# Patient Record
Sex: Male | Born: 1955 | Race: White | Hispanic: No | Marital: Married | Smoking: Former smoker
Health system: Southern US, Community
[De-identification: ages and names within clinical notes are randomized; demographics above are authoritative.]

## PROBLEM LIST (undated history)

## (undated) DIAGNOSIS — E119 Type 2 diabetes mellitus without complications: Secondary | ICD-10-CM

## (undated) DIAGNOSIS — K429 Umbilical hernia without obstruction or gangrene: Secondary | ICD-10-CM

## (undated) DIAGNOSIS — F419 Anxiety disorder, unspecified: Secondary | ICD-10-CM

## (undated) DIAGNOSIS — I10 Essential (primary) hypertension: Secondary | ICD-10-CM

## (undated) DIAGNOSIS — G4733 Obstructive sleep apnea (adult) (pediatric): Secondary | ICD-10-CM

## (undated) DIAGNOSIS — F32A Depression, unspecified: Secondary | ICD-10-CM

## (undated) DIAGNOSIS — F329 Major depressive disorder, single episode, unspecified: Secondary | ICD-10-CM

## (undated) DIAGNOSIS — I208 Other forms of angina pectoris: Secondary | ICD-10-CM

## (undated) DIAGNOSIS — I2089 Other forms of angina pectoris: Secondary | ICD-10-CM

## (undated) DIAGNOSIS — M1990 Unspecified osteoarthritis, unspecified site: Secondary | ICD-10-CM

## (undated) DIAGNOSIS — K219 Gastro-esophageal reflux disease without esophagitis: Secondary | ICD-10-CM

---
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
## 1974-07-25 HISTORY — PX: APPENDECTOMY: SHX54

## 1979-03-26 HISTORY — PX: INCISION AND DRAINAGE ABSCESS ANAL: SUR669

## 1989-03-25 HISTORY — PX: UVULECTOMY: SHX2631

## 1989-03-25 HISTORY — PX: SEPTOPLASTY: SUR1290

## 2004-07-25 HISTORY — PX: CARDIAC CATHETERIZATION: SHX172

## 2013-11-04 ENCOUNTER — Emergency Department (HOSPITAL_COMMUNITY): Payer: Self-pay

## 2013-11-04 ENCOUNTER — Observation Stay (HOSPITAL_COMMUNITY)
Admission: EM | Admit: 2013-11-04 | Discharge: 2013-11-06 | Disposition: A | Discharge status: Home / Self Care | Payer: Self-pay | Attending: Internal Medicine | Admitting: Internal Medicine

## 2013-11-04 ENCOUNTER — Encounter (HOSPITAL_COMMUNITY): Payer: Self-pay | Admitting: Emergency Medicine

## 2013-11-04 DIAGNOSIS — R079 Chest pain, unspecified: Secondary | ICD-10-CM

## 2013-11-04 DIAGNOSIS — R0602 Shortness of breath: Secondary | ICD-10-CM | POA: Insufficient documentation

## 2013-11-04 DIAGNOSIS — E1165 Type 2 diabetes mellitus with hyperglycemia: Secondary | ICD-10-CM

## 2013-11-04 DIAGNOSIS — Z23 Encounter for immunization: Secondary | ICD-10-CM | POA: Insufficient documentation

## 2013-11-04 DIAGNOSIS — Z8679 Personal history of other diseases of the circulatory system: Secondary | ICD-10-CM | POA: Insufficient documentation

## 2013-11-04 DIAGNOSIS — Z79899 Other long term (current) drug therapy: Secondary | ICD-10-CM | POA: Insufficient documentation

## 2013-11-04 DIAGNOSIS — R5381 Other malaise: Secondary | ICD-10-CM | POA: Insufficient documentation

## 2013-11-04 DIAGNOSIS — Z8659 Personal history of other mental and behavioral disorders: Secondary | ICD-10-CM | POA: Insufficient documentation

## 2013-11-04 DIAGNOSIS — E119 Type 2 diabetes mellitus without complications: Secondary | ICD-10-CM | POA: Insufficient documentation

## 2013-11-04 DIAGNOSIS — I200 Unstable angina: Secondary | ICD-10-CM | POA: Diagnosis present

## 2013-11-04 DIAGNOSIS — R0789 Other chest pain: Principal | ICD-10-CM | POA: Insufficient documentation

## 2013-11-04 DIAGNOSIS — R5383 Other fatigue: Secondary | ICD-10-CM

## 2013-11-04 DIAGNOSIS — M549 Dorsalgia, unspecified: Secondary | ICD-10-CM | POA: Insufficient documentation

## 2013-11-04 DIAGNOSIS — I10 Essential (primary) hypertension: Secondary | ICD-10-CM

## 2013-11-04 DIAGNOSIS — IMO0002 Reserved for concepts with insufficient information to code with codable children: Secondary | ICD-10-CM

## 2013-11-04 HISTORY — DX: Major depressive disorder, single episode, unspecified: F32.9

## 2013-11-04 HISTORY — DX: Depression, unspecified: F32.A

## 2013-11-04 HISTORY — DX: Obstructive sleep apnea (adult) (pediatric): G47.33

## 2013-11-04 HISTORY — DX: Unspecified osteoarthritis, unspecified site: M19.90

## 2013-11-04 HISTORY — DX: Umbilical hernia without obstruction or gangrene: K42.9

## 2013-11-04 HISTORY — DX: Essential (primary) hypertension: I10

## 2013-11-04 HISTORY — DX: Other forms of angina pectoris: I20.8

## 2013-11-04 HISTORY — DX: Other forms of angina pectoris: I20.89

## 2013-11-04 HISTORY — DX: Anxiety disorder, unspecified: F41.9

## 2013-11-04 HISTORY — DX: Type 2 diabetes mellitus without complications: E11.9

## 2013-11-04 HISTORY — DX: Gastro-esophageal reflux disease without esophagitis: K21.9

## 2013-11-04 LAB — LIPID PANEL
CHOL/HDL RATIO: 5.9 ratio
Cholesterol: 165 mg/dL (ref 0–200)
HDL: 28 mg/dL — ABNORMAL LOW (ref >39)
LDL CALC: 88 mg/dL (ref 0–99)
Triglycerides: 243 mg/dL — ABNORMAL HIGH (ref <150)
VLDL: 49 mg/dL — ABNORMAL HIGH (ref 0–40)

## 2013-11-04 LAB — CBC WITH DIFFERENTIAL/PLATELET
Basophils Absolute: 0 10*3/uL (ref 0.0–0.1)
Basophils Relative: 0 % (ref 0–1)
EOS PCT: 2 % (ref 0–5)
Eosinophils Absolute: 0.2 10*3/uL (ref 0.0–0.7)
HCT: 45.2 % (ref 39.0–52.0)
HEMOGLOBIN: 16.3 g/dL (ref 13.0–17.0)
LYMPHS ABS: 2.3 10*3/uL (ref 0.7–4.0)
Lymphocytes Relative: 29 % (ref 12–46)
MCH: 30.8 pg (ref 26.0–34.0)
MCHC: 36.1 g/dL — ABNORMAL HIGH (ref 30.0–36.0)
MCV: 85.3 fL (ref 78.0–100.0)
Monocytes Absolute: 0.6 10*3/uL (ref 0.1–1.0)
Monocytes Relative: 8 % (ref 3–12)
Neutro Abs: 5 10*3/uL (ref 1.7–7.7)
Neutrophils Relative %: 61 % (ref 43–77)
Platelets: 163 10*3/uL (ref 150–400)
RBC: 5.3 MIL/uL (ref 4.22–5.81)
RDW: 13.2 % (ref 11.5–15.5)
WBC: 8.1 10*3/uL (ref 4.0–10.5)

## 2013-11-04 LAB — TROPONIN I: Troponin I: <0.3 ng/mL (ref <0.30)

## 2013-11-04 LAB — HEMOGLOBIN A1C
Hgb A1c MFr Bld: 9.4 % — ABNORMAL HIGH (ref <5.7)
Mean Plasma Glucose: 223 mg/dL — ABNORMAL HIGH (ref <117)

## 2013-11-04 LAB — RAPID URINE DRUG SCREEN, HOSP PERFORMED
AMPHETAMINES: NOT DETECTED
BENZODIAZEPINES: NOT DETECTED
Barbiturates: NOT DETECTED
Cocaine: NOT DETECTED
Opiates: NOT DETECTED
Tetrahydrocannabinol: NOT DETECTED

## 2013-11-04 LAB — COMPREHENSIVE METABOLIC PANEL
ALT: 21 U/L (ref 0–53)
AST: 19 U/L (ref 0–37)
Albumin: 3.9 g/dL (ref 3.5–5.2)
Alkaline Phosphatase: 62 U/L (ref 39–117)
BUN: 12 mg/dL (ref 6–23)
CALCIUM: 9.5 mg/dL (ref 8.4–10.5)
CO2: 21 mEq/L (ref 19–32)
Chloride: 107 mEq/L (ref 96–112)
Creatinine, Ser: 0.75 mg/dL (ref 0.50–1.35)
GLUCOSE: 204 mg/dL — AB (ref 70–99)
Potassium: 4.3 mEq/L (ref 3.7–5.3)
Sodium: 144 mEq/L (ref 137–147)
Total Bilirubin: 1 mg/dL (ref 0.3–1.2)
Total Protein: 6.9 g/dL (ref 6.0–8.3)

## 2013-11-04 LAB — PRO B NATRIURETIC PEPTIDE: Pro B Natriuretic peptide (BNP): 25.3 pg/mL (ref 0–125)

## 2013-11-04 LAB — GLUCOSE, CAPILLARY
Glucose-Capillary: 95 mg/dL (ref 70–99)
Glucose-Capillary: 232 mg/dL — ABNORMAL HIGH (ref 70–99)

## 2013-11-04 LAB — TSH: TSH: 3.07 u[IU]/mL (ref 0.350–4.500)

## 2013-11-04 LAB — D-DIMER, QUANTITATIVE (NOT AT ARMC)

## 2013-11-04 MED ORDER — ATORVASTATIN CALCIUM 80 MG PO TABS
80.0000 mg | ORAL_TABLET | Freq: Every day | ORAL | Status: DC
  Filled 2013-11-04: qty 1

## 2013-11-04 MED ORDER — ASPIRIN EC 81 MG PO TBEC
81.0000 mg | DELAYED_RELEASE_TABLET | Freq: Every day | ORAL | Status: DC
  Administered 2013-11-05 – 2013-11-06 (×2): 81 mg via ORAL
  Filled 2013-11-04 (×2): qty 1

## 2013-11-04 MED ORDER — HEPARIN (PORCINE) IN NACL 100-0.45 UNIT/ML-% IJ SOLN
1600.0000 [IU]/h | INTRAMUSCULAR | Status: DC
  Administered 2013-11-04: 1300 [IU]/h via INTRAVENOUS
  Administered 2013-11-05: 1600 [IU]/h via INTRAVENOUS
  Filled 2013-11-04 (×3): qty 250

## 2013-11-04 MED ORDER — INSULIN ASPART 100 UNIT/ML ~~LOC~~ SOLN
0.0000 [IU] | Freq: Three times a day (TID) | SUBCUTANEOUS | Status: DC
  Administered 2013-11-05: 3 [IU] via SUBCUTANEOUS

## 2013-11-04 MED ORDER — ENOXAPARIN SODIUM 40 MG/0.4ML ~~LOC~~ SOLN
40.0000 mg | SUBCUTANEOUS | Status: DC
  Filled 2013-11-04: qty 0.4

## 2013-11-04 MED ORDER — NITROGLYCERIN 0.4 MG SL SUBL: 0.4000 mg | SUBLINGUAL_TABLET | SUBLINGUAL | Status: DC | PRN

## 2013-11-04 MED ORDER — HEPARIN BOLUS VIA INFUSION
3000.0000 [IU] | Freq: Once | INTRAVENOUS | Status: AC
  Administered 2013-11-04: 3000 [IU] via INTRAVENOUS
  Filled 2013-11-04: qty 3000

## 2013-11-04 MED ORDER — NITROGLYCERIN 0.4 MG SL SUBL
0.4000 mg | SUBLINGUAL_TABLET | SUBLINGUAL | Status: DC | PRN
  Filled 2013-11-04: qty 1

## 2013-11-04 NOTE — ED Notes (Signed)
Report called to [place] Park[place], Charity fundraiserN. Patient and family updated on room assignment.

## 2013-11-04 NOTE — Consult Note (Signed)
Patient seen and examined, chart and labs reviewed.  Patient presented to ER with complaints of chest pain.  He had similar CP in 2005 although much worse than now with normal workup including cardiac cath (per the patient done for abnormal nuclear stress test) [facility] .  He had some CP and saw his PCP a few weeks ago who put him on antibiotics.  He said he initially felt better but this am started having 4/10 chest pain that he described as an aching sensation with pain in his upper back.  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 He felt weak with the CP but did not have syncope.  He denies any recent URI symptoms and nothing really made the pain better but is made worse with activity.  He currently is pain free.  EKG is nonischemic and initial troponin and BNP are normal.  Will continue to cycle cardiac enzymes and if normal then plan 2 day stress myoview in am.

## 2013-11-04 NOTE — Consult Note (Signed)
CARDIOLOGY CONSULT NOTE   Patient ID: Marcus Mercer MRN: [id number] DOB/AGE: 1956-05-15 58 y.o.  Admit date: 11/04/2013  Primary Physician   Jacob MooresMike Carroll, [facility] Primary Cardiologist   New Reason for Consultation   Chest pain  WUJ:WJXBJHPI:Marcus Mercer is a 58 y.o. male with no history of CAD. Hx DM, unknown lipid status but hx abnl lipid profile. U/A to tolerate statins. He had chest pain in 2005 and was assessed [facility] with a stress test (abnormal) and subsequent heart cath (clean). He has had some chest pain and saw his family MD about 3 weeks ago. He was put on antibiotics, and symptoms improved.   This am, he was having upper back pain and then developed chest pain, 4/10, aching. He felt weak, but no syncope. No change with deep inspiration, no N&V, no diaphoresis. No chest wall tenderness. No URI symptoms, fevers or chills. He came to the ER by EMS when his symptoms did not resolve and his symptoms finally resolved without any specific therapy. He has had ASA, no NTG or pain meds. He is currently resting comfortably. Will order echo, ck lipids, add BB. If lipids abnl or CAD confirmed, he will need statin. Had problems w/ statin medIcations in the past but could possibly tolerate a low dose of a weak statin.  Otherwise, per IM HTN [facility]   Past Medical History  Diagnosis Date  . Diabetes mellitus without complication   . Angina at rest   . Anxiety     had some depression but says resolved after he left his last job  . HTN (hypertension)     improved with weight loss  . Umbilical hernia   . Sleep apnea     not on cpap     Past Surgical History  Procedure Laterality Date  . Appendectomy  1976  . Nose surgery    . Uvulectomy      Allergies  Allergen Reactions  . Statins     Muscle aches    I have reviewed the patient's current medications . [START ON 11/05/2013] aspirin EC  81 mg Oral Daily  . insulin aspart  0-9 Units Subcutaneous TID WC      nitroGLYCERIN  Prior to Admission medications   Medication Sig Start Date End Date Taking? Authorizing Provider  glipiZIDE (GLUCOTROL) 10 MG tablet Take 10 mg by mouth daily before breakfast.   Yes Historical Provider, MD  metFORMIN (GLUCOPHAGE) 1000 MG tablet Take 1,000 mg by mouth 2 (two) times daily with a meal.   Yes Historical Provider, MD  naproxen sodium (ANAPROX) 220 MG tablet Take 440 mg by mouth daily.   Yes Historical Provider, MD     History   Social History  . Marital Status: Married    Spouse Name: N/A    Number of Children: N/A  . Years of Education: N/A   Occupational History  . service Multimedia programmeradvisor     [employer]   Social History Main Topics  . Smoking status: Former Games developermoker  . Smokeless tobacco: Not on file     Comment: smoked in 5th grade and quit in 7th grade  . Alcohol Use: No  . Drug Use: No  . Sexual Activity: Not on file   Other Topics Concern  . 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 Heart attack Father 7340    death from CHF age 60  . Heart disease Brother     2 stents  . Diabetes Mother   . Diabetes Father   . Diabetes Brother   . Heart failure Father   . Cancer Mother     ovarian  . Stroke      paternal     ROS: Has lost 50 lbs in the past year or so; deliberately changing eating habits. Full 14 point review of systems complete and found to be negative unless listed above.  Physical Exam: Blood pressure 122/86, pulse 64, temperature 98.3 F (36.8 C), temperature source Oral, resp. rate 18, height 5\' 10"  (1.778 m), weight 254 lb (115.214 kg), SpO2 98.00%.  General: Well developed, well nourished, male in no acute distress Head: Eyes PERRLA, No xanthomas.   Normocephalic and atraumatic, oropharynx without edema or exudate. Dentition: good Lungs: CTA bilaterally Heart: HRRR S1 S2, no  rub/gallop, no murmur. pulses are 2+ all 4 extrem.   Neck: No carotid bruits. No lymphadenopathy.  JVD not elevated. Abdomen: Bowel sounds present, abdomen soft and non-tender without masses or hernias noted. Msk:  No spine or cva tenderness. No weakness, no joint deformities or effusions. Extremities: No clubbing or cyanosis. No edema.  Neuro: Alert and oriented X 3. No focal deficits noted. Psych:  Good affect, responds appropriately Skin: No rashes or lesions noted.  Labs:   Lab Results  Component Value Date   WBC 8.1 11/04/2013   HGB 16.3 11/04/2013   HCT 45.2 11/04/2013   MCV 85.3 11/04/2013   PLT 163 11/04/2013    Recent Labs Lab 11/04/13 1118  NA 144  K 4.3  CL 107  CO2 21  BUN 12  CREATININE 0.75  CALCIUM 9.5  PROT 6.9  BILITOT 1.0  ALKPHOS 62  ALT 21  AST 19  GLUCOSE 204*  ALBUMIN 3.9    Recent Labs  11/04/13 1118  TROPONINI <0.30   Pro B Natriuretic peptide (BNP)  Date/Time Value Ref Range Status  11/04/2013 11:18 AM 25.3  0 - 125 pg/mL Final     ECG: SR, no acute ischemic changes  Radiology:  Dg Chest Port 1 View 11/04/2013   CLINICAL DATA:  Chest pain, shortness of Breath  EXAM: PORTABLE CHEST - 1 VIEW  COMPARISON:  None.  FINDINGS: The heart size and mediastinal contours are within normal limits. Both lungs are clear. The visualized skeletal structures are unremarkable.  IMPRESSION: No active disease.   Electronically Signed   By: Natasha MeadLiviu  Pop M.D.   On: 11/04/2013 12:10    ASSESSMENT AND PLAN:   The patient was seen today by Dr. Mayford Knifeurner, the patient evaluated and the data reviewed.  Principal Problem:   Chest pain - symptoms worse with activity but ECG and initial enzymes are negative. Continue to cycle enzymes. Prev cath was clean, but that was a decade ago. Pt may not have good CRF control. If ez remain negative, CL in am. May need to do 2-day study. If ez positive, will need cath. Plan discussed w/ patient who agrees.    Signed: Darrol JumpRhonda G Barrett,  PA-C 11/04/2013 4:29 PM Beeper [phone]  Co-Sign MD

## 2013-11-04 NOTE — Progress Notes (Signed)
ANTICOAGULATION CONSULT NOTE - Initial Consult  Pharmacy Consult for Heparin Indication: chest pain/ACS  Allergies  Allergen Reactions  . Statins     Muscle aches    Patient Measurements: Height: 5\' 10"  (177.8 cm) Weight: 254 lb (115.214 kg) IBW/kg (Calculated) : 73 Heparin Dosing Weight: 98.4 kg  Vital Signs: Temp: 98.3 F (36.8 C) (04/13 1420) Temp src: Oral (04/13 1420) BP: 122/86 mmHg (04/13 1420) Pulse Rate: 64 (04/13 1420)  Labs:  Recent Labs  11/04/13 1118  HGB 16.3  HCT 45.2  PLT 163  CREATININE 0.75  TROPONINI <0.30    Estimated Creatinine Clearance: 126.4 ml/min (by C-G formula based on Cr of 0.75).   Medical History: Past Medical History  Diagnosis Date  . Diabetes mellitus without complication   . Angina at rest   . Anxiety     had some depression but says resolved after he left his last job  . HTN (hypertension)     improved with weight loss  . Umbilical hernia   . Sleep apnea     not on cpap    Assessment: 58 y.o. M who presented with CP and pharmacy has been consulted to start heparin for ACS sx while awaiting further cardiology work-up. Initial enzymes are negative, are cycling - will follow-up. Baseline CBC wnl, no hx CVA or recent surgery noted. Hep Wt: 98.4 kg  Goal of Therapy:  Heparin level 0.3-0.7 units/ml Monitor platelets by anticoagulation protocol: Yes   Plan:  1. Heparin 4000 unit bolus x 1 2. Initiate heparin at a drip rate of 1300 unit/hr 3. Daily heparin levels 4. Will continue to monitor for any signs/symptoms of bleeding and will follow up with heparin level in 6 hours   Georgina PillionElizabeth Saraiyah Hemminger, PharmD, BCPS Clinical Pharmacist Pager: [phone] 11/04/2013 4:53 PM

## 2013-11-04 NOTE — ED Notes (Signed)
midsternal chest pain radiating into bilateral biceps 0530. Aspirin given and no nitro given. CHest pain went away in route. Hernia on abdomen. Chest pain worse with movement.

## 2013-11-04 NOTE — ED Notes (Signed)
Patient states he had a short episode of chest pressure that only lasted a few minutes. Denies any shortness of breath or diaphoresis. VSS.

## 2013-11-04 NOTE — H&P (Signed)
Date: 11/04/2013               Patient Name:  Marcus Mercer MRN: [id number]  DOB: 07/23/1956 Age / Sex: 58 y.o., male   PCP: No Pcp Per Patient         Medical Service: Internal Medicine Teaching Service         Attending Physician: Dr. Burns Spain, MD    First Contact: Darcey Nora, MS4   Pager: [phone]  Second Contact: Dr. Virgina Organ Pager: [phone]       After Hours (After 5p/  First Contact Pager: [phone]  weekends / holidays): Second Contact Pager: [phone]   Chief Complaint: Chest pain  History of Present Illness: Marcus Mercer is a 58 year old obese white male with PMH of DM2, hyperlipidemia, HTN (on no medication), anxiety, and sleep apnea (not on cpap) who presented to the ED via ambulance for complaints of substernal chest pain/pressure this morning.  He claims his pain started yesterday morning with pain noted between his shoulders that was intermittent, max 4-5/10 on pain scale, and spontaneously resolved. Then this morning around 9-10am, he felt substernal chest pressure, 3/10, and radiating to b/l upper arms.  The chest pain is aching in nature, improved after sitting down and lying back not exacerbated with movement. The pain is not related to food and he denies current heart burn but has had it in the past  He says he has had similar pain in the past with negative work up.  In 2006, he apparently had a cath but no blockages were found at [facility] in [place].  He also reports similar chest pain in November 2013 with negative stress test done at [facility] per patient. His wife and daughter were present in the room as well who endorse that the patient and family have been under a lot of stress lately, with frequent loss of employment, recent diagnosis of wife with stage 3 breast cancer, and no health insurance.  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  He also is noted to suffer from  anxiety at times and depression in the past that was treated with celexa.  He no longer takes any antidepressants. He denies any shortness of breath, no chest pain at this time, no diaphoresis, nausea, vomiting, or diarrhea, but did have brief episodes x2 of chest aching since arriving to the ED (one in the ED and the other on the floor).  Of note, he did received amoxicillin x 10 days by his PCP Dr. Noralyn Pick for similar complaints ~ 3 weeks ago.  However, he says he does not know why he was given the antibiotics but did complete the course.   He does admit to driving ~57 minutes daily back and forth from work to home and has lost ~55lbs over the past year without trying but has been eating less with decreased appetite. He endorses night sweats for several months-years and generalized fatigue since the pain started.   Meds: Current Facility-Administered Medications  Medication Dose Route Frequency Provider Last Rate Last Dose  . [START ON 11/05/2013] aspirin EC tablet 81 mg  81 mg Oral Daily Darden Palmer, MD      . enoxaparin (LOVENOX) injection 40 mg  40 mg Subcutaneous Q24H Darden Palmer, MD      . insulin aspart (novoLOG) injection 0-9 Units  0-9 Units Subcutaneous TID WC Darden Palmer, MD      .  nitroGLYCERIN (NITROSTAT) SL tablet 0.4 mg  0.4 mg Sublingual Q5 Min x 3 PRN Darden PalmerSamaya Marigrace Mccole, MD       Allergies: Allergies as of 11/04/2013 - Review Complete 11/04/2013  Allergen Reaction Noted  . Statins  11/04/2013   Past Medical History  Diagnosis Date  . Diabetes mellitus without complication   . Angina at rest   . Anxiety   . HTN (hypertension)     improved with weight loss   History reviewed. No pertinent past surgical history. History reviewed. No pertinent family history. History   Social History  . Marital Status: Married    Spouse Name: N/A    Number of Children: N/A  . Years of Education: N/A   Occupational History  . service Multimedia programmeradvisor     [employer] Deal[employer]   Social History  Main Topics  . Smoking status: Never Smoker   . Smokeless tobacco: Not on file  . Alcohol Use: Not on file  . Drug Use: Not on file  . Sexual Activity: Not on file   Other Topics Concern  . Not on file   Social History Narrative  . No narrative on file   Review of Systems:  Constitutional:  Decreased appetite, fatigue. Denies fever, chills, diaphoresis.  HEENT:  Denies congestion, sore throat, rhinorrhea  Respiratory:  Denies SOB, DOE, cough, and wheezing.   Cardiovascular:  Chest pain. Denies palpitations and leg swelling.   Gastrointestinal:  Denies nausea, vomiting, abdominal pain, diarrhea  Genitourinary:  Denies dysuria   Musculoskeletal:  Chronic back pain.   Skin:  Denies pallor, rash and wound.   Neurological:  Denies dizziness, syncope.   Physical Exam: Blood pressure 122/86, pulse 64, temperature 98.3 F (36.8 C), temperature source Oral, resp. rate 18, height 5\' 10"  (1.778 m), weight 254 lb (115.214 kg), SpO2 98.00%. Vitals reviewed. General: resting in bed, NAD HEENT: EOMI Cardiac: RRR Pulm: clear to auscultation bilaterally Abd: soft, nontender, obese, nondistended, BS present, +umbilical hernia--non-tender Ext: warm and well perfused, no pedal edema, moving all 4 extremities, +2dp b/l Neuro: alert and oriented X3, strength and sensation to light touch equal in bilateral upper and lower extremities  Lab results: Basic Metabolic Panel:  Recent Labs  16/04/9603/13/15 1118  NA 144  K 4.3  CL 107  CO2 21  GLUCOSE 204*  BUN 12  CREATININE 0.75  CALCIUM 9.5   Liver Function Tests:  Recent Labs  11/04/13 1118  AST 19  ALT 21  ALKPHOS 62  BILITOT 1.0  PROT 6.9  ALBUMIN 3.9   CBC:  Recent Labs  11/04/13 1118  WBC 8.1  NEUTROABS 5.0  HGB 16.3  HCT 45.2  MCV 85.3  PLT 163   Cardiac Enzymes:  Recent Labs  11/04/13 1118  TROPONINI <0.30   BNP:  Recent Labs  11/04/13 1118  PROBNP 25.3   Imaging results:  Dg Chest Port 1  View  11/04/2013   CLINICAL DATA:  Chest pain, shortness of Breath  EXAM: PORTABLE CHEST - 1 VIEW  COMPARISON:  None.  FINDINGS: The heart size and mediastinal contours are within normal limits. Both lungs are clear. The visualized skeletal structures are unremarkable.  IMPRESSION: No active disease.   Electronically Signed   By: Natasha MeadLiviu  Pop M.D.   On: 11/04/2013 12:10   Other results: EKG: 80bpm, sinus rhythm, baseline wander  Assessment & Plan by Problem: Principal Problem:   Unstable angina Marcus Mercer is a 58 year old male with PMH of HTN, DM2, and  anxiety admitted for unstable angina.  Unstable angina--x2 days, intermittent. Prior similar episodes with negative work up, but last cath noted to be in 2006 per patient. Received ASA with EMS but no NTG. Chest pressure resolved at time of admission. On no anti-hypertensives but does report distant history of it that improved after weight loss.  Does have diabetes and significant family history notable for his dead to have an MI in 7340's and die of CHF and brother who has had 2 stents. TIMI 2 with 8% risk. First troponin negative with no ischemic changes on EKG. PE unlikely given no tachycardia, no sob, and no leg pain but does have travel history daily. Modified geneva of score of 0, low risk.  Aortic dissection is also considered given pain between shoulders, but no evidence of widened mediastinum on xray and no major discrepancy between blood pressures on right and left arm.  -admit to tele -try to get old records of cath -cycle CE -lipid panel, a1c, tsh -AM EKG -nitro prn -oxygen prn, keep o2 sat >92% -morphine prn -hold BB given HR of 60's -hold statin--pt reports intolerance with statin in the past with severe muscle aches -start heparin gtt -consulted cards--likely stress in AM vs. Cath -ASA 81mg  qd -d-dimer  DM2--unknown baseline. On glipizide 10mg  qam and metformin 1000mg  bid.   -f/u a1c -ssi sensitive -cbg monitoring  HTN--not  on medications. Normotensive on admission.  -continue to monitor  Diet: Heart Healthy DVT Ppx: Heparin gtt Dispo: Disposition is deferred at this time, awaiting improvement of current medical problems. Anticipated discharge in approximately 2-3 day(s).   The patient does have a current PCP (Dr. Noralyn Pickarroll) and [facility] after discharge.  The patient does not have transportation limitations that hinder transportation to clinic appointments.  Signed: Darden PalmerSamaya Atzel Mccambridge, MD 11/04/2013, 3:34 PM

## 2013-11-04 NOTE — ED Notes (Signed)
Pt is chest pain free at this time 

## 2013-11-04 NOTE — ED Provider Notes (Signed)
CSN: [id number]     Arrival date & time 11/04/13  1026 History   First MD Initiated Contact with Patient 11/04/13 1036     Chief Complaint  Patient presents with  . Chest Pain     (Consider location/radiation/quality/duration/timing/severity/associated sxs/prior Treatment) HPI Comments: Patient presents to the ER for evaluation of chest pain. Patient reports that he had trouble sleeping last night. He has been very fatigued lately. Yesterday he started noticing pain between the shoulder blades. This was intermittent, moderate in nature. Around 5:30 AM he started to notice a heaviness and tightness in his chest. This radiated to the arms. He went to work and the symptoms worsened. He reports that the symptoms worsened when he exerts and got better when he sat down and rested.  He is brought to the ER by ambulance. He was given aspirin prior to arrival. His pain subsided during transport, was not given any nitroglycerin. Patient does, however, indicate that he is now feeling some tightness in the chest and the pain between his shoulder blades has returned.  Patient has an extensive family history of heart disease. Father had an MI in his 2140s, died of congestive heart failure. He has multiple brothers with coronary artery disease and stents.  Patient is a 58 y.o. male presenting with chest pain.  Chest Pain Associated symptoms: back pain, fatigue and shortness of breath     Past Medical History  Diagnosis Date  . Diabetes mellitus without complication   . Angina at rest   . Anxiety    History reviewed. No pertinent past surgical history. History reviewed. No pertinent family history. History  Substance Use Topics  . Smoking status: Never Smoker   . Smokeless tobacco: Not on file  . Alcohol Use: Not on file    Review of Systems  Constitutional: Positive for fatigue.  Respiratory: Positive for shortness of breath.   Cardiovascular: Positive for chest pain.  Musculoskeletal:  Positive for back pain.  All other systems reviewed and are negative.     Allergies  Review of patient's allergies indicates no known allergies.  Home Medications   Current Outpatient Rx  Name  Route  Sig  Dispense  Refill  . glipiZIDE (GLUCOTROL) 10 MG tablet   Oral   Take 10 mg by mouth daily before breakfast.         . metFORMIN (GLUCOPHAGE) 1000 MG tablet   Oral   Take 1,000 mg by mouth 2 (two) times daily with a meal.         . naproxen sodium (ANAPROX) 220 MG tablet   Oral   Take 440 mg by mouth daily.          BP 125/80  Pulse 76  Temp(Src) 98.2 F (36.8 C) (Oral)  Resp 18  Ht 5\' 10"  (1.778 m)  Wt 254 lb (115.214 kg)  BMI 36.45 kg/m2  SpO2 97% Physical Exam  Constitutional: He is oriented to person, place, and time. He appears well-developed and well-nourished. No distress.  HENT:  Head: Normocephalic and atraumatic.  Right Ear: Hearing normal.  Left Ear: Hearing normal.  Nose: Nose normal.  Mouth/Throat: Oropharynx is clear and moist and mucous membranes are normal.  Eyes: Conjunctivae and EOM are normal. Pupils are equal, round, and reactive to light.  Neck: Normal range of motion. Neck supple.  Cardiovascular: Regular rhythm, S1 normal and S2 normal.  Exam reveals no gallop and no friction rub.   No murmur heard. Pulmonary/Chest: Effort normal and breath  sounds normal. No respiratory distress. He exhibits no tenderness.  Abdominal: Soft. Normal appearance and bowel sounds are normal. There is no hepatosplenomegaly. There is no tenderness. There is no rebound, no guarding, no tenderness at McBurney's point and negative Murphy's sign. No hernia.  Musculoskeletal: Normal range of motion.  Neurological: He is alert and oriented to person, place, and time. He has normal strength. No cranial nerve deficit or sensory deficit. Coordination normal. GCS eye subscore is 4. GCS verbal subscore is 5. GCS motor subscore is 6.  Skin: Skin is warm, dry and intact.  No rash noted. No cyanosis.  Psychiatric: He has a normal mood and affect. His speech is normal and behavior is normal. Thought content normal.    ED Course  Procedures (including critical care time) Labs Review Labs Reviewed  CBC WITH DIFFERENTIAL - Abnormal; Notable for the following:    MCHC 36.1 (*)    All other components within normal limits  COMPREHENSIVE METABOLIC PANEL - Abnormal; Notable for the following:    Glucose, Bld 204 (*)    All other components within normal limits  PRO B NATRIURETIC PEPTIDE  TROPONIN I   Imaging Review Dg Chest Port 1 View  11/04/2013   CLINICAL DATA:  Chest pain, shortness of Breath  EXAM: PORTABLE CHEST - 1 VIEW  COMPARISON:  None.  FINDINGS: The heart size and mediastinal contours are within normal limits. Both lungs are clear. The visualized skeletal structures are unremarkable.  IMPRESSION: No active disease.   Electronically Signed   By: Natasha MeadLiviu  Pop M.D.   On: 11/04/2013 12:10     EKG Interpretation None      Date: 11/04/2013  Rate: 80  Rhythm: normal sinus rhythm  QRS Axis: normal  Intervals: normal  ST/T Wave abnormalities: nonspecific T wave changes  Conduction Disutrbances:none  Narrative Interpretation:   Old EKG Reviewed: none available    MDM   Final diagnoses:  Chest pain   Patient presents to the ER for evaluation of chest pain. He had onset of pain, at rest, earlier this morning. He did feel like the pain worsened with moving and walking, the pain had radiation to both arms. He has had some pain into his back as well. These symptoms have resolved at this time. He does have cardiac risk factors, including diabetes and significant family history. Patient reports a previous history of chest pain in 2006. At that time he had a Cardiolite stress test and was told that there were no abnormalities.  Patient's workup thus far has been normal. He continues to be pain-free. Because of his cardiac risk factors, will observe in the  hospital overnight for cardiac rule out.    Gilda Creasehristopher J. Aryn Safran, MD 11/04/13 [phone]

## 2013-11-04 NOTE — H&P (Signed)
Date: 11/04/2013               Patient Name:  Marcus Mercer MRN: [id number]  DOB: 09/16/1955 Age / Sex: 58 y.o., male   PCP: No Pcp Per Patient              Medical Service: Internal Medicine Teaching Service              Attending Physician: Dr. Burns SpainElizabeth A Butcher, MD    First Contact: Darcey Noraustin Ky Rumple, MS 4 Pager: [phone]  Second Contact: Dr. Virgina OrganQureshi Pager: [phone]            After Hours (After 5p/  First Contact Pager: [phone]  weekends / holidays): Second Contact Pager: [phone]   Chief Complaint: chest pain  History of Present Illness: 58 yo M with history of anxiety and T2DM who presents with chest pain radiating to both arms and between the shoulder blades, as well as shortness of breath. He describes intermittent aching back pain between the shoulder blades, 4-5/10 severity starting yesterday afternoon. He recalls doing "nothing in particular" with the onset. This morning he felt much more fatigued than usual. At work ~9:30 this morning he had onset of pain (3-4/10) in the center of his chest and radiating to both biceps. She was also short of breath. No associated N/V, diaphoresis, syncope. CP/SOB occurred while he was walking at work but he doesn't feel he was particularly exerting himself. He sat down to rest, which provided some relief. and EMS was called. He received aspirin but not nitroglycerin in route to the hospital, because his chest pain had subsided. He has had similar chest pain and SOB intermittently today while at rest. His back pain continues as well, but he is not sure it's related to his chest pain. He describes occasional leg aches, but no acute pain or swelling. No recent immobilization, although he does drive a lot for work. No history of DVT/PE.  The patient has a history of chest pain and SOB. He was at his primary doctor's office 3 weeks ago "with the same problem", and received 10 days amoxicillin for potential respiratory infection. He's also had extensive  cardiac [facility], including stress test and a catheterization in 2006 that did not result in stent placement. In the past, he's been on nitroglycerin that didn't help with chest pain and anti-hypertensives. However, he has lost 50 lbs over the last year and no longer required medication for BP. He was previously on statins, but had muscle aches with both Crestor and Lipitor. He's also been diagnosed with OSA, but does not use CPAP/BIPAP at home. He is s/p uvula excision and nasal septum surgery.  He also has a history of anxiety and panic attacks. He describes his most recent panic attack as ~2 years ago occuring while in the car driving to work. He associates it with a stressful situation at his work. He was briefly on citalopram and thinks he benefited, but stopped taking it when his symptoms improved. He describes multiple stressors at work and home. In the summer of 2013, his wife was diagnosed with stage 3 breast CA. Last summer he lost his job and has started a new one in December. Continues to have stressors with supervisor at work. His wife mentioned financial stressors as well, including a near loss of their home.  Meds: Current Facility-Administered Medications  Medication Dose Route Frequency Provider Last Rate Last Dose  . [START ON 11/05/2013] aspirin  EC tablet 81 mg  81 mg Oral Daily Darden Palmer, MD      . atorvastatin (LIPITOR) tablet 80 mg  80 mg Oral q1800 Darden Palmer, MD      . enoxaparin (LOVENOX) injection 40 mg  40 mg Subcutaneous Q24H Darden Palmer, MD      . insulin aspart (novoLOG) injection 0-9 Units  0-9 Units Subcutaneous TID WC Darden Palmer, MD      . nitroGLYCERIN (NITROSTAT) SL tablet 0.4 mg  0.4 mg Sublingual Q5 min PRN Gilda Crease, MD      . nitroGLYCERIN (NITROSTAT) SL tablet 0.4 mg  0.4 mg Sublingual Q5 Min x 3 PRN Darden Palmer, MD        Allergies: Allergies as of 11/04/2013  . (No Known Allergies)   Past  Medical History  Diagnosis Date  . Diabetes mellitus without complication   . Angina at rest   . Anxiety     had some depression but says resolved after he left his last job  . HTN (hypertension)     improved with weight loss  . Umbilical hernia   . Sleep apnea     not on cpap   Past Surgical History  Procedure Laterality Date  . Appendectomy  1976  . Nose surgery    . Uvulectomy     Family History  Problem Relation Age of Onset  . Heart attack Father 3    death from CHF age 97  . Heart disease Brother     2 stents  . Diabetes Mother   . Diabetes Father   . Diabetes Brother   . Heart failure Father   . Cancer Mother     ovarian  . Stroke      paternal   History   Social History  . Marital Status: Married    Spouse Name: N/A    Number of Children: N/A  . Years of Education: N/A   Occupational History  . 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 Smoking status: Former Games developer  . Smokeless tobacco: Not on file     Comment: smoked in 5th grade and quit in 7th grade  . Alcohol Use: No  . Drug Use: No  . Sexual Activity: Not on file   Other Topics Concern  . 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   2 children--daughters   Married          Review of Systems: Constitutional: positive for night sweats and weight loss Respiratory: negative for cough and wheezing  Cardiovascular: positive for chest pain, chest pressure/discomfort and dyspnea, negative for exertional chest pressure/discomfort, lower extremity edema, orthopnea, paroxysmal nocturnal dyspnea and syncope Gastrointestinal: negative for constipation, diarrhea, blood in stool Genitourinary:negative Musculoskeletal:negative Neurological: negative for dizziness and vertigo Endocrine: negative   Physical Exam: Blood pressure 122/86, pulse 64, temperature 98.3 F (36.8 C), temperature source Oral, resp. rate 18, height 5\' 10"   (1.778 m), weight 115.214 kg (254 lb), SpO2 98.00%.  General appearance: alert, cooperative and mildly obese Head: Normocephalic, without obvious abnormality, atraumatic Nose: no discharge, no sinus tenderness Throat: normal findings: lips normal without lesions, buccal mucosa normal and palate normal and abnormal findings: uvula absent Neck: no adenopathy, no carotid bruit and no JVD Lungs: clear to auscultation bilaterally Chest wall: no tenderness Heart: regular rate and rhythm, S1, S2 normal, no murmur, click, rub or  gallop Abdomen: soft, non-tender; bowel sounds normal; no masses,  no organomegaly Extremities: extremities normal, atraumatic, no cyanosis or edema and no edema, redness or tenderness in the calves or thighs Pulses: 2+ and symmetric  Lab results: Results for Marcus ShihBURROW, Marcus (MRN [id number]) as of 11/04/2013 14:46  Ref. Range 11/04/2013 11:18  Sodium 137-147 mEq/L 144  Potassium 3.7-5.3 mEq/L 4.3  Chloride 96-112 mEq/L 107  CO2  19-32 mEq/L 21  BUN  6-23 mg/dL 12  Creatinine  4.09-8.110.50-1.35 mg/dL 9.140.75  Calcium  7.8-29.58.4-10.5 mg/dL 9.5  GFR calc non Af Amer  >90 mL/min >90  GFR calc Af Amer  >90 mL/min >90  Glucose  70-99 mg/dL 621204 (H)  Alkaline Phosphatase  39-117 U/L 62  Albumin  3.5-5.2 g/dL 3.9  AST  3-080-37 U/L 19  ALT  0-53 U/L 21  Total Protein  6.0-8.3 g/dL 6.9  Total Bilirubin  6.5-7.80.3-1.2 mg/dL 1.0  Troponin I Latest Range: <0.30 ng/mL <0.30  Pro B Natriuretic peptide (BNP)  0-125 pg/mL 25.3  WBC  4.0-10.5 K/uL 8.1  RBC  4.22-5.81 MIL/uL 5.30  Hemoglobin Latest Range: 13.0-17.0 g/dL 46.916.3  HCT  62.9-52.839.0-52.0 % 45.2  MCV  78.0-100.0 fL 85.3  MCH  26.0-34.0 pg 30.8  MCHC  30.0-36.0 g/dL 41.336.1 (H)  RDW  24.4-01.011.5-15.5 % 13.2  Platelets 150-400 K/uL 163    Imaging results:  Dg Chest Port 1 View  11/04/2013   CLINICAL DATA:  Chest pain, shortness of Breath  EXAM: PORTABLE CHEST - 1 VIEW  COMPARISON:  None.  FINDINGS: The heart size and mediastinal contours are within normal  limits. Both lungs are clear. The visualized skeletal structures are unremarkable.  IMPRESSION: No active disease.   Electronically Signed   By: Natasha MeadLiviu  Pop M.D.   On: 11/04/2013 12:10    Other results: EKG: normal EKG, normal sinus rhythm, there are no previous tracings available for comparison.  Assessment & Plan by Problem: Active Problems:   Chest pain  Chest pain: Non-exertional, intermittent chest pain at rest radiating to both arms, associated with shortness of breath and back pain. EKG without ST changes, initial troponin negative. Most likely symptoms related to anxiety in the setting of multiple work, family, and financial stressors. Differential diagnosis includes unstable angina, aortic dissection given back pain and radiation to both arms. CXR negative for mediastinal widening. Less likely is PE since modified geneva score is 0. [facility] - heparin drip because of concern for unstable angina/NSTEMI - cycle troponins - repeat EKG tomorrow morning - stress test while inpatient - morphine prn for chest/back pain - D-dimer to rule out low-probability DVT and aortic dissection - lipid panel pending - hold statin given history of myalgias - TSH pending - UDS pending - HIV screen - nitroglycerin SL prn  Anxiety: History of panic attacks with reported response to citalopram. Multiple work, family, and financial stressors. - monitor for anxiety symptoms, may give alprazolam inpatient - will consider restarting citalopram or other SSRI if cardiac workup is negative  OSA: May contribute to cardiovascular risk. S/p uvula and nasal surgeries. Patient states he "needs another sleep study", likely to find cpap/bipap settings. Financial constraints make this difficult.  T2DM: Managed with metformin and glipizide at home. Holding metformin in case of contrast imaging study. - Hgb A1c pending - insulin sliding scale  DVT Ppx: lovenox subQ  PCP: Jacob MooresMike  Carroll, PA; Creta LevinStallings and [place], [place] Aye[place]  This is a Psychologist, occupationalMedical Student Note.  The  care of the patient was discussed with Dr. Virgina Organ and the assessment and plan was formulated with their assistance.  Please see their note for official documentation of the patient encounter.   Signed: Orland Dec, Med Student 11/04/2013, 2:44 PM

## 2013-11-04 NOTE — Progress Notes (Signed)
Pt c/o of CP 3/10 in middle of chest, non-radiating; EKG obtained, CP has now resolved; lasted 5-10 mins

## 2013-11-05 ENCOUNTER — Observation Stay (HOSPITAL_COMMUNITY): Payer: Self-pay

## 2013-11-05 ENCOUNTER — Other Ambulatory Visit: Payer: Self-pay

## 2013-11-05 ENCOUNTER — Observation Stay (HOSPITAL_COMMUNITY): Payer: MEDICAID

## 2013-11-05 ENCOUNTER — Encounter (HOSPITAL_COMMUNITY): Payer: Self-pay | Admitting: Internal Medicine

## 2013-11-05 DIAGNOSIS — IMO0002 Reserved for concepts with insufficient information to code with codable children: Secondary | ICD-10-CM | POA: Diagnosis present

## 2013-11-05 DIAGNOSIS — E1165 Type 2 diabetes mellitus with hyperglycemia: Secondary | ICD-10-CM | POA: Diagnosis present

## 2013-11-05 DIAGNOSIS — IMO0001 Reserved for inherently not codable concepts without codable children: Secondary | ICD-10-CM

## 2013-11-05 DIAGNOSIS — R079 Chest pain, unspecified: Secondary | ICD-10-CM

## 2013-11-05 DIAGNOSIS — I517 Cardiomegaly: Secondary | ICD-10-CM

## 2013-11-05 LAB — HIV ANTIBODY (ROUTINE TESTING W REFLEX): HIV 1&2 Ab, 4th Generation: NONREACTIVE

## 2013-11-05 LAB — GLUCOSE, CAPILLARY
Glucose-Capillary: 202 mg/dL — ABNORMAL HIGH (ref 70–99)
Glucose-Capillary: 242 mg/dL — ABNORMAL HIGH (ref 70–99)
Glucose-Capillary: 243 mg/dL — ABNORMAL HIGH (ref 70–99)
Glucose-Capillary: 257 mg/dL — ABNORMAL HIGH (ref 70–99)

## 2013-11-05 LAB — HEPARIN LEVEL (UNFRACTIONATED): Heparin Unfractionated: 0.12 IU/mL — ABNORMAL LOW (ref 0.30–0.70)

## 2013-11-05 MED ORDER — TECHNETIUM TC 99M SESTAMIBI GENERIC - CARDIOLITE
30.0000 | Freq: Once | INTRAVENOUS | Status: AC | PRN
  Administered 2013-11-05: 30 via INTRAVENOUS

## 2013-11-05 MED ORDER — REGADENOSON 0.4 MG/5ML IV SOLN
INTRAVENOUS | Status: AC
  Administered 2013-11-05: 0.4 mg
  Filled 2013-11-05: qty 5

## 2013-11-05 MED ORDER — REGADENOSON 0.4 MG/5ML IV SOLN
0.4000 mg | Freq: Once | INTRAVENOUS | Status: DC
  Filled 2013-11-05: qty 5

## 2013-11-05 MED ORDER — INSULIN ASPART 100 UNIT/ML ~~LOC~~ SOLN
0.0000 [IU] | Freq: Three times a day (TID) | SUBCUTANEOUS | Status: DC
  Administered 2013-11-05 – 2013-11-06 (×4): 5 [IU] via SUBCUTANEOUS

## 2013-11-05 MED ORDER — HEPARIN BOLUS VIA INFUSION
2000.0000 [IU] | Freq: Once | INTRAVENOUS | Status: AC
  Administered 2013-11-05: 2000 [IU] via INTRAVENOUS
  Filled 2013-11-05: qty 2000

## 2013-11-05 MED ORDER — TECHNETIUM TC 99M SESTAMIBI GENERIC - CARDIOLITE
10.0000 | Freq: Once | INTRAVENOUS | Status: AC | PRN
  Administered 2013-11-05: 10 via INTRAVENOUS

## 2013-11-05 MED ORDER — PNEUMOCOCCAL VAC POLYVALENT 25 MCG/0.5ML IJ INJ
0.5000 mL | INJECTION | INTRAMUSCULAR | Status: AC
  Administered 2013-11-06: 0.5 mL via INTRAMUSCULAR
  Filled 2013-11-05: qty 0.5

## 2013-11-05 MED ORDER — PERFLUTREN LIPID MICROSPHERE
1.0000 mL | INTRAVENOUS | Status: AC | PRN
  Administered 2013-11-05: 2 mL via INTRAVENOUS
  Filled 2013-11-05: qty 10

## 2013-11-05 NOTE — Progress Notes (Signed)
CSW (Clinical Child psychotherapistocial Worker) received consult but does not appear that there are any CSW needs at this time. Please reconsult if social work needs arise.  Kass Herberger, LCSWA [phone]

## 2013-11-05 NOTE — Progress Notes (Signed)
Subjective: 58 yo M with HTN, HLD, T2DM who presented with chest pain and SOB. Overnight did not have any chest pain or shortness of breath. This morning he is still quite fatigued. He was not interested in PT. Back pain is still consistently present but improved from yesterday. He did not sleep well last night and felt restless. First part of stress test completed this AM.  Objective: Vital signs in last 24 hours: Filed Vitals:   11/04/13 1420 11/04/13 2100 11/05/13 0500 11/05/13 0924  BP: 122/86 137/84 127/77 131/93  Pulse: 64 84 88 72  Temp: 98.3 F (36.8 C) 99 F (37.2 C) 98 F (36.7 C)   TempSrc: Oral     Resp: 18 16 16    Height: 5\' 10"  (1.778 m)     Weight: 115.214 kg (254 lb)  113.399 kg (250 lb)   SpO2: 98% 98% 99%    Weight change:   Intake/Output Summary (Last 24 hours) at 11/05/13 0958 Last data filed at 11/05/13 0500  Gross per 24 hour  Intake      0 ml  Output    525 ml  Net   -525 ml   General appearance: alert, cooperative and mildly obese  Head: Normocephalic, without obvious abnormality, atraumatic  Neck: no adenopathy, no carotid bruit and no JVD  Lungs: clear to auscultation bilaterally  Chest wall: no tenderness  Heart: regular rate and rhythm, S1, S2 normal, no murmur, click, rub or gallop  Abdomen: reducible periumbilical hernia; soft, non-tender; bowel sounds normal; no masses, no organomegaly Extremities: extremities normal, atraumatic, no cyanosis or edema and no edema, redness or tenderness in the calves or thighs  Pulses: 2+ and symmetric  Lab Results: Lipid Panel     Component Value Date/Time   CHOL 165 11/04/2013 1657   TRIG 243* 11/04/2013 1657   HDL 28* 11/04/2013 1657   CHOLHDL 5.9 11/04/2013 1657   VLDL 49* 11/04/2013 1657   LDLCALC 88 11/04/2013 1657   Troponin <0.3 x 3 D-Dimer < 0.27   Ref. Range 11/04/2013 16:57 11/04/2013 18:26  Hemoglobin A1C  <5.7 % 9.4 (H)   TSH  0.350-4.500 uIU/mL 3.070        HIV  NONREACTIVE  NONREACTIVE     U-tox     Amphetamines  NONE DETECTED   NONE DETECTED  Barbiturates  NONE DETECTED   NONE DETECTED  Benzodiazepines  NONE DETECTED   NONE DETECTED  Opiates  NONE DETECTED   NONE DETECTED  COCAINE  NONE DETECTED   NONE DETECTED  Tetrahydrocannabinol  NONE DETECTED   NONE DETECTED   Studies/Results: Dg Chest Port 1 View  11/04/2013   CLINICAL DATA:  Chest pain, shortness of Breath  EXAM: PORTABLE CHEST - 1 VIEW  COMPARISON:  None.  FINDINGS: The heart size and mediastinal contours are within normal limits. Both lungs are clear. The visualized skeletal structures are unremarkable.  IMPRESSION: No active disease.   Electronically Signed   By: Natasha MeadLiviu  Pop M.D.   On: 11/04/2013 12:10    Medications: I have reviewed the patient's current medications. Scheduled Meds: . aspirin EC  81 mg Oral Daily  . insulin aspart  0-9 Units Subcutaneous TID WC   Continuous Infusions:  PRN Meds:.nitroGLYCERIN Assessment/Plan: Principal Problem:   Unstable angina Active Problems:   Diabetes type 2, uncontrolled  Unstable angina--x2 days, intermittent. Prior similar episodes with negative work up, but last cath noted to be in 2006 per patient. On no anti-hypertensives but does report distant  history of it that improved after weight loss. Does have diabetes and significant family history. TIMI 2 with 8% risk. Troponins, negative no ischemic changes on EKG. PE unlikely; modified geneva of score of 0, low risk; negative D-dimer. Aortic dissection also very unlikely with negative D-dimer and normal mediastinum by CXR. Chest pain and SOB are consistent with symptomatic anxiety with multiple family and work stressors. However, further cardiac workup to r/o ischemia is warranted.   - [facility] - car[facility] following, appreciate recommendations - myovue stress test inpatient - TTE pending - repeat EKG pending  - nitro prn  - oxygen prn, keep o2 sat >92%  - morphine prn  - ASA  81mg  qd  - hold BB given HR of 60's  - hold statin--pt reports intolerance with Lipitor and Crestor in the past with severe muscle aches; if stress test results suggest CAD, will try lower potency statin - d/c heparin gtt in the absence of continued chest pain and normal troponins  HLD - Non-fasting lipid panel with normal LDL, elevated TGs, low HDL. If there is evidence of CAD, will consider statin as above. Otherwise, will avoid due to history of myalgias.  DM2--unknown baseline. Hgb A1c inpatient is 9.4. On glipizide 10mg  qam and metformin 1000mg  bid at home. Will likely continue home po medications and encourage discussion of insulin with PCP.  -ssi moderate -cbg monitoring   HTN--not on medications. Normotensive on admission.  -continue to monitor   Diet: Heart Healthy  DVT Ppx: enoxaparin  Dispo: Two day stress test in progress.    This is a Psychologist, occupationalMedical Student Note.  The care of the patient was discussed with Dr. Virgina OrganQureshi and the assessment and plan formulated with their assistance.  Please see their attached note for official documentation of the daily encounter.   LOS: 1 day   Orland Decustin Eli Quame Spratlin, Med Student 11/05/2013, 9:58 AM

## 2013-11-05 NOTE — H&P (Signed)
  I have seen and examined the patient myself, and I have reviewed the note by Darcey Noraustin Bosch, MS 4 and was present during the interview and physical exam.  Please see my separate H&P from 11/04/13 for additional findings, assessment, and plan.   Signed: Darden PalmerSamaya Estephani Popper, MD 11/05/2013, 7:10 AM

## 2013-11-05 NOTE — Progress Notes (Signed)
  I have seen and examined the patient, and reviewed the daily progress note by Darcey Noraustin Bosch, MS 4 and discussed the care of the patient with them. Please see my progress note from 11/05/2013 for further details regarding assessment and plan.    Signed:  Darden PalmerSamaya Sohil Timko, MD 11/05/2013, 4:40 PM

## 2013-11-05 NOTE — Progress Notes (Signed)
Echocardiogram 2D Echocardiogram with Definity has been performed.  Marcus Mercer A Marcus Mercer 11/05/2013, 12:36 PM

## 2013-11-05 NOTE — H&P (Signed)
  Date: 11/05/2013  Patient name: Marcus Mercer  Medical record number: [id number]  Date of birth: 23-Jun-1956   I have seen and evaluated Marcus Mercer and discussed their care with the Residency Team. Marcus Mercer has no known CAD but has had cath in 2006 and stress test in 2013 which was reportedly normal. He was admitted with unstable angina and started on heparin gtt. EKG and trops have been nl, without ischemia. Is getting two day stress test.   Assessment and Plan: I have seen and evaluated the patient as outlined above. I agree with the formulated Assessment and Plan as detailed in the residents' admission note, with the following changes:   1. Unstable angina - no AMI. Getting stress test. Stop heparin gtt. No further CP overnight. If stress test normal, etiology uncertain as no GI sxs and anxiety would be d/o exclusion. PE R/O D dimer.   2. Un controlled DM - A1C 9. Only sees MD about once a year - only when he needs something. Encouraged him to see MD q 3 months to get DM under control. Thinks his DM is well controlled as long as his vision is oK, no polyuria / polydipsia, and not jittery.   Likely home in AM if stress test OK.  Burns SpainElizabeth A Gjon Letarte, MD 4/14/20151:39 PM

## 2013-11-05 NOTE — Progress Notes (Signed)
UR completed 

## 2013-11-05 NOTE — Progress Notes (Signed)
Subjective: [name] was gone for first part of his nuclear study this morning but returned this morning feeling very tired. His wife was present in the room who said he did not sleep very well last night and was anxious. He does not wish to work with physical therapy and will ambulate on his own.   Objective: Vital signs in last 24 hours: Filed Vitals:   11/05/13 0924 11/05/13 0958 11/05/13 1001 11/05/13 1003  BP: 131/93 156/97 133/91 119/86  Pulse: 72 86 96 94  Temp:      TempSrc:      Resp:      Height:      Weight:      SpO2:       Weight change:   Intake/Output Summary (Last 24 hours) at 11/05/13 1113 Last data filed at 11/05/13 0500  Gross per 24 hour  Intake      0 ml  Output    525 ml  Net   -525 ml   Vitals reviewed. General: resting in bed, NAD, feeling tired HEENT: wearing glasses, EOMI, no scleral icterus Cardiac: RRR, no rubs, murmurs or gallops Pulm: clear to auscultation bilaterally, no wheezes, rales, or rhonchi Abd: soft, nontender, obese, nondistended, BS present Ext: warm and well perfused, moving all 4 extremities Neuro: alert and oriented X3, sensation grossly intact  Lab Results: Basic Metabolic Panel:  Recent Labs Lab 11/04/13 1118  NA 144  K 4.3  CL 107  CO2 21  GLUCOSE 204*  BUN 12  CREATININE 0.75  CALCIUM 9.5   Liver Function Tests:  Recent Labs Lab 11/04/13 1118  AST 19  ALT 21  ALKPHOS 62  BILITOT 1.0  PROT 6.9  ALBUMIN 3.9   CBC:  Recent Labs Lab 11/04/13 1118  WBC 8.1  NEUTROABS 5.0  HGB 16.3  HCT 45.2  MCV 85.3  PLT 163   Cardiac Enzymes:  Recent Labs Lab 11/04/13 1118 11/04/13 1657 11/04/13 2240  TROPONINI <0.30 <0.30 <0.30   BNP:  Recent Labs Lab 11/04/13 1118  PROBNP 25.3   D-Dimer:  Recent Labs Lab 11/04/13 1657  DDIMER <0.27   CBG:  Recent Labs Lab 11/04/13 1612 11/04/13 2038 11/05/13 0746  GLUCAP 95 232* 202*   Hemoglobin A1C:  Recent Labs Lab 11/04/13 1657  HGBA1C  9.4*   Fasting Lipid Panel:  Recent Labs Lab 11/04/13 1657  CHOL 165  HDL 28*  LDLCALC 88  TRIG [id number]*  CHOLHDL 5.9   Thyroid Function Tests:  Recent Labs Lab 11/04/13 1657  TSH 3.070   Urine Drug Screen: Drugs of Abuse     Component Value Date/Time   LABOPIA NONE DETECTED 11/04/2013 1826   COCAINSCRNUR NONE DETECTED 11/04/2013 1826   LABBENZ NONE DETECTED 11/04/2013 1826   AMPHETMU NONE DETECTED 11/04/2013 1826   THCU NONE DETECTED 11/04/2013 1826   LABBARB NONE DETECTED 11/04/2013 1826    Studies/Results: Dg Chest Port 1 View  11/04/2013   CLINICAL DATA:  Chest pain, shortness of Breath  EXAM: PORTABLE CHEST - 1 VIEW  COMPARISON:  None.  FINDINGS: The heart size and mediastinal contours are within normal limits. Both lungs are clear. The visualized skeletal structures are unremarkable.  IMPRESSION: No active disease.   Electronically Signed   By: Natasha MeadLiviu  Pop M.D.   On: 11/04/2013 12:10   Medications: I have reviewed the patient's current medications. Scheduled Meds: . aspirin EC  81 mg Oral Daily  . insulin aspart  0-15 Units Subcutaneous TID  WC   Continuous Infusions:   PRN Meds:.nitroGLYCERIN Assessment/Plan: Principal Problem:   Unstable angina Active Problems:   Diabetes type 2, uncontrolled  Unstable angina--risk factors include obesity, uncontrolled DM2, and distance history of HTN along with signficant family history of heart disease in father and brother.  TIMI 2. Prior similar episodes of pain. Reported negative work up as far back as 2006 per patient. Anxiety likely a component as well.  Cardiac enzymes have been negative with no obvious ischemic changes on initial EKGs. D-dimer negative making PE and dissection very unlikely.  -try to get old records of cath  -lipid panel with TG 243 not fasting, LDL 88, chol 165, a1c 9.4, tsh 3.070 -AM EKG  -nitro prn  -oxygen prn, keep o2 sat >92%  -morphine prn  -held BB initially given HR of 60's, may consider starting  low dose? -hold statin for now--pt reports intolerance with statin in the past with severe muscle aches  -d/c heparin gtt  -appreciate cardiology following, 2 day stress myoview -ASA 81mg  qd  -echo pending  DM2--unknown baseline. On glipizide 10mg  qam and metformin 1000mg  bid. A1C 9.2 -ssi moderate, may add basal but patient has significant financial difficulties and likely will not be able to afford, he will need to check with his pcp and hopefully arrange financial assistance if possible -cbg monitoring   Dispo: Disposition is deferred at this time, awaiting improvement of current medical problems.  Anticipated discharge in approximately 2-3 day(s). Consulted social work and Sports coachCase manager for financial and insurance assistance.  The patient does have a current PCP (Dr. Noralyn Pickarroll) and [facility] after discharge.  The patient does not have transportation limitations that hinder transportation to clinic appointments.  Services Needed at time of discharge: Y = Yes, Blank = No PT:   OT:   RN:   Equipment:   Other:     LOS: 1 day   Darden PalmerSamaya Deijah Spikes, MD 11/05/2013, 11:13 AM

## 2013-11-05 NOTE — Progress Notes (Signed)
Inpatient Diabetes Program Recommendations  AACE/ADA: New Consensus Statement on Inpatient Glycemic Control (2013)  Target Ranges:  Prepandial:   less than 140 mg/dL      Peak postprandial:   less than 180 mg/dL (1-2 hours)      Critically ill patients:  140 - 180 mg/dL   Inpatient Diabetes Program Recommendations Insulin - Basal: Please add some basal while here: Lantus or Levemir today/HS 10 units. Fasting high this am.  Thank you, Lenor CoffinAnn Mayfield Schoene, RN, CNS, Diabetes Coordinator [phone])

## 2013-11-05 NOTE — Progress Notes (Signed)
ANTICOAGULATION CONSULT NOTE - Follow Up Consult  Pharmacy Consult for heparin  Indication: chest pain/ACS  Allergies  Allergen Reactions  . Statins     Muscle aches    Patient Measurements: Height: 5\' 10"  (177.8 cm) Weight: 254 lb (115.214 kg) IBW/kg (Calculated) : 73 Heparin Dosing Weight:   Vital Signs: Temp: 99 F (37.2 C) (04/13 2100) BP: 137/84 mmHg (04/13 2100) Pulse Rate: 84 (04/13 2100)  Labs:  Recent Labs  11/04/13 1118 11/04/13 1657 11/04/13 2240 11/05/13 0045  HGB 16.3  --   --   --   HCT 45.2  --   --   --   PLT 163  --   --   --   HEPARINUNFRC  --   --   --  0.12*  CREATININE 0.75  --   --   --   TROPONINI <0.30 <0.30 <0.30  --     Estimated Creatinine Clearance: 126.4 ml/min (by C-G formula based on Cr of 0.75).   Medications:  Heparin at 1300/hr   Assessment: Heparin subtherapeutic at 0.12 Iu/ml no bleeding noted. Goal of Therapy:  Heparin level 0.3-0.7 units/ml Monitor platelets by anticoagulation protocol: Yes   Plan:  rebolus with 2000 units and increase to 1600 units/hr with 6 hr f/u HL  Marcus [name] 11/05/2013,3:24 AM

## 2013-11-05 NOTE — Progress Notes (Signed)
Subjective: [name] was gone for first part of his nuclear study this morning. His wife was present in the room who said he did not sleep very well last night and was anxious.   Objective: Vital signs in last 24 hours: Filed Vitals:   11/04/13 1400 11/04/13 1420 11/04/13 2100 11/05/13 0500  BP: 123/61 122/86 137/84 127/77  Pulse: 77 64 84 88  Temp:  98.3 F (36.8 C) 99 F (37.2 C) 98 F (36.7 C)  TempSrc:  Oral    Resp: 19 18 16 16   Height:  5\' 10"  (1.778 m)    Weight:  254 lb (115.214 kg)  250 lb (113.399 kg)  SpO2: 97% 98% 98% 99%   Weight change:   Intake/Output Summary (Last 24 hours) at 11/05/13 0830 Last data filed at 11/05/13 0500  Gross per 24 hour  Intake      0 ml  Output    525 ml  Net   -525 ml   Unable to do physical exam this morning as patient was not present, will check back later  Lab Results: Basic Metabolic Panel:  Recent Labs Lab 11/04/13 1118  NA 144  K 4.3  CL 107  CO2 21  GLUCOSE 204*  BUN 12  CREATININE 0.75  CALCIUM 9.5   Liver Function Tests:  Recent Labs Lab 11/04/13 1118  AST 19  ALT 21  ALKPHOS 62  BILITOT 1.0  PROT 6.9  ALBUMIN 3.9   CBC:  Recent Labs Lab 11/04/13 1118  WBC 8.1  NEUTROABS 5.0  HGB 16.3  HCT 45.2  MCV 85.3  PLT 163   Cardiac Enzymes:  Recent Labs Lab 11/04/13 1118 11/04/13 1657 11/04/13 2240  TROPONINI <0.30 <0.30 <0.30   BNP:  Recent Labs Lab 11/04/13 1118  PROBNP 25.3   D-Dimer:  Recent Labs Lab 11/04/13 1657  DDIMER <0.27   CBG:  Recent Labs Lab 11/04/13 1612 11/04/13 2038 11/05/13 0746  GLUCAP 95 232* 202*   Hemoglobin A1C:  Recent Labs Lab 11/04/13 1657  HGBA1C 9.4*   Fasting Lipid Panel:  Recent Labs Lab 11/04/13 1657  CHOL 165  HDL 28*  LDLCALC 88  TRIG 161243*  CHOLHDL 5.9   Thyroid Function Tests:  Recent Labs Lab 11/04/13 1657  TSH 3.070   Urine Drug Screen: Drugs of Abuse     Component Value Date/Time   LABOPIA NONE DETECTED  11/04/2013 1826   COCAINSCRNUR NONE DETECTED 11/04/2013 1826   LABBENZ NONE DETECTED 11/04/2013 1826   AMPHETMU NONE DETECTED 11/04/2013 1826   THCU NONE DETECTED 11/04/2013 1826   LABBARB NONE DETECTED 11/04/2013 1826    Studies/Results: Dg Chest Port 1 View  11/04/2013   CLINICAL DATA:  Chest pain, shortness of Breath  EXAM: PORTABLE CHEST - 1 VIEW  COMPARISON:  None.  FINDINGS: The heart size and mediastinal contours are within normal limits. Both lungs are clear. The visualized skeletal structures are unremarkable.  IMPRESSION: No active disease.   Electronically Signed   By: Natasha MeadLiviu  Pop M.D.   On: 11/04/2013 12:10   Medications: I have reviewed the patient's current medications. Scheduled Meds: . aspirin EC  81 mg Oral Daily  . insulin aspart  0-9 Units Subcutaneous TID WC   Continuous Infusions: . heparin 1,600 Units/hr (11/05/13 0804)   PRN Meds:.nitroGLYCERIN Assessment/Plan: Principal Problem:   Unstable angina Active Problems:   Diabetes type 2, uncontrolled  Unstable angina--risk factors include obesity, uncontrolled DM2, and distance history of HTN along with  signficant family history of heart disease in father and brother.  TIMI 2. Prior similar episodes of pain. Reported negative work up as far back as 2006 per patient. Anxiety likely a component as well.  Cardiac enzymes have been negative with no obvious ischemic changes on initial EKGs.  -try to get old records of cath  -lipid panel with TG 243 not fasting, LDL 88, chol 165, a1c 9.4, tsh 3.070 -AM EKG  -nitro prn  -oxygen prn, keep o2 sat >92%  -morphine prn  -held BB initially given HR of 60's, may consider starting low dose? -hold statin for now--pt reports intolerance with statin in the past with severe muscle aches  -d/c heparin gtt  -appreciate cardiology following, 2 day stress myoview -ASA 81mg  qd  -d-dimer negative -echo pending  DM2--unknown baseline. On glipizide 10mg  qam and metformin 1000mg  bid. A1C  9.2 -ssi moderate, may add basal but patient has significant financial difficulties and likely will not be able to afford, he will need to check with his pcp and hopefully arrange financial assistance if possible -cbg monitoring   Dispo: Disposition is deferred at this time, awaiting improvement of current medical problems.  Anticipated discharge in approximately 2-3 day(s).   The patient does have a current PCP (Dr. Noralyn Pickarroll) and [facility] after [facility].  The patient does not have transportation limitations that hinder transportation to clinic appointments.  Services Needed at time of discharge: Y = Yes, Blank = No PT:   OT:   RN:   Equipment:   Other:     LOS: 1 day   Marcus PalmerSamaya Bush Murdoch, MD 11/05/2013, 8:30 AM

## 2013-11-05 NOTE — Progress Notes (Signed)
Patient ID: Gaylord Shihommy Emmick, male   DOB: [birth year], 58 y.o.   MRN: [id number]    Subjective:  Having first part of nuclear study  Discussed with wife   Objective:  Filed Vitals:   11/04/13 1400 11/04/13 1420 11/04/13 2100 11/05/13 0500  BP: 123/61 122/86 137/84 127/77  Pulse: 77 64 84 88  Temp:  98.3 F (36.8 C) 99 F (37.2 C) 98 F (36.7 C)  TempSrc:  Oral    Resp: 19 18 16 16   Height:  5\' 10"  (1.778 m)    Weight:  254 lb (115.214 kg)  250 lb (113.399 kg)  SpO2: 97% 98% 98% 99%    Intake/Output from previous day:  Intake/Output Summary (Last 24 hours) at 11/05/13 0853 Last data filed at 11/05/13 0500  Gross per 24 hour  Intake      0 ml  Output    525 ml  Net   -525 ml    Physical Exam: Affect appropriate Obese male  HEENT: normal Neck supple with no adenopathy JVP normal no bruits no thyromegaly Lungs clear with no wheezing and good diaphragmatic motion Heart:  S1/S2 no murmur, no rub, gallop or click PMI normal Abdomen: benighn, BS positve, no tenderness, no AAA no bruit.  No HSM or HJR Distal pulses intact with no bruits No edema Neuro non-focal Skin warm and dry No muscular weakness   Lab Results: Basic Metabolic Panel:  Recent Labs  40/98/1104/13/15 1118  NA 144  K 4.3  CL 107  CO2 21  GLUCOSE 204*  BUN 12  CREATININE 0.75  CALCIUM 9.5   Liver Function Tests:  Recent Labs  11/04/13 1118  AST 19  ALT 21  ALKPHOS 62  BILITOT 1.0  PROT 6.9  ALBUMIN 3.9   CBC:  Recent Labs  11/04/13 1118  WBC 8.1  NEUTROABS 5.0  HGB 16.3  HCT 45.2  MCV 85.3  PLT 163   Cardiac Enzymes:  Recent Labs  11/04/13 1118 11/04/13 1657 11/04/13 2240  TROPONINI <0.30 <0.30 <0.30    D-Dimer:  Recent Labs  11/04/13 1657  DDIMER <0.27   Hemoglobin A1C:  Recent Labs  11/04/13 1657  HGBA1C 9.4*   Fasting Lipid Panel:  Recent Labs  11/04/13 1657  CHOL 165  HDL 28*  LDLCALC 88  TRIG [id number]*  CHOLHDL 5.9   Thyroid Function Tests:  Recent  Labs  11/04/13 1657  TSH 3.070    Imaging: Dg Chest Port 1 View  11/04/2013   CLINICAL DATA:  Chest pain, shortness of Breath  EXAM: PORTABLE CHEST - 1 VIEW  COMPARISON:  None.  FINDINGS: The heart size and mediastinal contours are within normal limits. Both lungs are clear. The visualized skeletal structures are unremarkable.  IMPRESSION: No active disease.   Electronically Signed   By: Natasha MeadLiviu  Pop M.D.   On: 11/04/2013 12:10    Cardiac Studies:  ECG:  NsR normal    Telemetry:  NSR no arrhythmia   Echo:   Medications:   . aspirin EC  81 mg Oral Daily  . insulin aspart  0-9 Units Subcutaneous TID WC       Assessment/Plan:  Chest Pain :  NO high risk features  R/O , normal ECG , previous cath with no CAD  2D myovue ordered by Dr Mayford Knifeurner  Echo also pending  DM:  Oral hypoglycemics held can probably restart in am  Diabetic diet   Wendall Stadeeter C Niccolas Loeper 11/05/2013, 8:53 AM

## 2013-11-06 DIAGNOSIS — F411 Generalized anxiety disorder: Secondary | ICD-10-CM

## 2013-11-06 LAB — GLUCOSE, CAPILLARY
Glucose-Capillary: 209 mg/dL — ABNORMAL HIGH (ref 70–99)
Glucose-Capillary: 224 mg/dL — ABNORMAL HIGH (ref 70–99)

## 2013-11-06 MED ORDER — NITROGLYCERIN 0.4 MG SL SUBL: 0.4000 mg | SUBLINGUAL_TABLET | SUBLINGUAL | Status: AC | PRN

## 2013-11-06 MED ORDER — ASPIRIN 81 MG PO TBEC: 81.0000 mg | DELAYED_RELEASE_TABLET | Freq: Every day | ORAL | Status: AC

## 2013-11-06 NOTE — Progress Notes (Signed)
Subjective: [name] was seen and examined at bedside this morning.  His wife was present in the room as well.  He reports feeling much better since admission, no pain overnight or today, slept well. Ready to go home.   Objective: Vital signs in last 24 hours: Filed Vitals:   11/05/13 1350 11/05/13 2000 11/05/13 2118 11/06/13 0500  BP: 128/78 162/80 135/84 125/74  Pulse: 92 70 81 64  Temp: 98.3 F (36.8 C) 98.9 F (37.2 C) 98.8 F (37.1 C) 98 F (36.7 C)  TempSrc: Oral  Oral   Resp: 16 16 16 16   Height:      Weight:    248 lb (112.492 kg)  SpO2: 96% 98% 96% 99%   Weight change: -6 lb (-2.722 kg)  Intake/Output Summary (Last 24 hours) at 11/06/13 0806 Last data filed at 11/05/13 1900  Gross per 24 hour  Intake    360 ml  Output      0 ml  Net    360 ml   Vitals reviewed. General: sitting up in bed, NAD HEENT: wearing glasses, EOMI, no scleral icterus Cardiac: RRR Pulm: clear to auscultation bilaterally, no wheezes, rales, or rhonchi Abd: soft, obese, nontender, nondistended, BS present Ext: warm and well perfused, moving all 4 extremities, no tenderness to palpation Neuro: alert and oriented X3, sensation grossly intact  Lab Results: Basic Metabolic Panel:  Recent Labs Lab 11/04/13 1118  NA 144  K 4.3  CL 107  CO2 21  GLUCOSE 204*  BUN 12  CREATININE 0.75  CALCIUM 9.5   Liver Function Tests:  Recent Labs Lab 11/04/13 1118  AST 19  ALT 21  ALKPHOS 62  BILITOT 1.0  PROT 6.9  ALBUMIN 3.9   CBC:  Recent Labs Lab 11/04/13 1118  WBC 8.1  NEUTROABS 5.0  HGB 16.3  HCT 45.2  MCV 85.3  PLT 163   Cardiac Enzymes:  Recent Labs Lab 11/04/13 1118 11/04/13 1657 11/04/13 2240  TROPONINI <0.30 <0.30 <0.30   BNP:  Recent Labs Lab 11/04/13 1118  PROBNP 25.3   D-Dimer:  Recent Labs Lab 11/04/13 1657  DDIMER <0.27   CBG:  Recent Labs Lab 11/04/13 2038 11/05/13 0746 11/05/13 1141 11/05/13 1628 11/05/13 2108 11/06/13 0734    GLUCAP 232* 202* 242* 243* 257* 209*   Hemoglobin A1C:  Recent Labs Lab 11/04/13 1657  HGBA1C 9.4*   Fasting Lipid Panel:  Recent Labs Lab 11/04/13 1657  CHOL 165  HDL 28*  LDLCALC 88  TRIG 409*  CHOLHDL 5.9   Thyroid Function Tests:  Recent Labs Lab 11/04/13 1657  TSH 3.070   Urine Drug Screen: Drugs of Abuse     Component Value Date/Time   LABOPIA NONE DETECTED 11/04/2013 1826   COCAINSCRNUR NONE DETECTED 11/04/2013 1826   LABBENZ NONE DETECTED 11/04/2013 1826   AMPHETMU NONE DETECTED 11/04/2013 1826   THCU NONE DETECTED 11/04/2013 1826   LABBARB NONE DETECTED 11/04/2013 1826    Studies/Results: Nm Myocar Multi W/spect W/wall Motion / Ef  11/05/2013   CLINICAL DATA:  Patient presented to ER with complaints of chest pain. He had similar CP in 2005 although much worse than now with normal workup including cardiac cath (per the patient done for abnormal nuclear stress test) at [facility] . He had some CP and saw his PCP a few weeks ago who put him on antibiotics. He said he initially felt better but this am started having 4/10 chest pain that he described as an  aching sensation with pain in his upper back. 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 He felt weak with the CP but did not have syncope. He denies any recent URI symptoms and nothing really made the pain better but is made worse with activity. He currently is pain free. EKG is nonischemic and initial troponin and BNP are normal.  EXAM: MYOCARDIAL IMAGING WITH SPECT (REST AND PHARMACOLOGIC-STRESS)  GATED LEFT VENTRICULAR WALL MOTION STUDY  LEFT VENTRICULAR EJECTION FRACTION  TECHNIQUE: Standard myocardial SPECT imaging was performed after resting intravenous injection of 10 mCi Tc-574m sestamibi. Subsequently, intravenous infusion of Lexiscan was performed under the supervision of the Cardiology staff. At peak effect of the drug, 30 mCi Tc-334m sestamibi was injected intravenously and standard myocardial SPECT imaging  was performed. Quantitative gated imaging was also performed to evaluate left ventricular wall motion, and estimate left ventricular ejection fraction.  COMPARISON:  None.  FINDINGS: There is decreased radiotracer uptake along the entire inferior wall distribution of moderate severity. This is likely representative of moderate diaphragmatic attenuation artifact. Old inferior infarct cannot be fully excluded although is less likely given the lack of wall motion abnormality. Ejection fraction 51%, no obvious wall motion abnormalities.  IMPRESSION: Overall low risk pharmacologic nuclear stress test with no evidence of ischemia. Fixed inferior wall defect most likely secondary to diaphragmatic attenuation artifact. Normal ejection fraction of 51% with no wall motion abnormalities. Sensitivity and specificity of study reduced by attenuation artifact.   Electronically Signed   By: Marcus SchultzMark  Mercer   On: 11/05/2013 14:20   Dg Chest Port 1 View  11/04/2013   CLINICAL DATA:  Chest pain, shortness of Breath  EXAM: PORTABLE CHEST - 1 VIEW  COMPARISON:  None.  FINDINGS: The heart size and mediastinal contours are within normal limits. Both lungs are clear. The visualized skeletal structures are unremarkable.  IMPRESSION: No active disease.   Electronically Signed   By: Marcus MeadLiviu  Mercer M.D.   On: 11/04/2013 12:10   Medications: I have reviewed the patient's current medications. Scheduled Meds: . aspirin EC  81 mg Oral Daily  . insulin aspart  0-15 Units Subcutaneous TID WC  . pneumococcal 23 valent vaccine  0.5 mL Intramuscular Tomorrow-1000   Continuous Infusions:   PRN Meds:.nitroGLYCERIN Assessment/Plan: Principal Problem:   Unstable angina Active Problems:   Diabetes type 2, uncontrolled  Unstable angina--risk factors include obesity, uncontrolled DM2, and distance history of HTN along with signficant family history of heart disease in father and brother.  Nuclear stress test completed 4/14 was overall low risk  with no evidence of ischemia. EF 51%.  Anxiety is likely a large component of the pain.  Cardiac enzymes have been negative with no obvious ischemic changes on initial EKGs. Lipid panel with TG 243 not fasting, LDL 88, chol 165, a1c 9.4, tsh 3.070 -nitro prn  -hold statin for now--pt reports intolerance with statin in the past with severe muscle aches  -appreciate cardiology following, 2--spoke with Dr. Eden EmmsNishan this morning, can be discharged with outpatient follow up -ASA 81mg  qd   DM2--On glipizide 10mg  qam and metformin 1000mg  bid. A1C 9.2 -ssi moderate -cbg monitoring  -will need to discuss insulin regimen with pcp as outpatient based on finances and further diabetic educaiton  Dispo: d/c home today  The patient does have a current PCP (Dr. Noralyn Pickarroll) and [facility].  The patient does not have transportation limitations that hinder transportation to clinic appointments.  Services Needed at time of discharge: Y = Yes, Blank = No PT:   OT:   RN:   Equipment:   Other:     LOS: 2 days   Darden PalmerSamaya Hagan Maltz, MD 11/06/2013, 8:06 AM

## 2013-11-06 NOTE — Discharge Summary (Signed)
Name: Marcus Mercer MRN: [id number] DOB: [birth year] 58 y.o. PCP: Dr. Noralyn Pick  Date of Admission: 11/04/2013 10:26 AM Date of Discharge: 11/06/2013 Attending Physician: Dr. Rogelia Boga Discharge Diagnosis:  Principal Problem:   Unstable angina Active Problems:   Diabetes type 2, uncontrolled  Discharge Medications:   Medication List    STOP taking these medications       naproxen sodium 220 MG tablet  Commonly known as:  ANAPROX      TAKE these medications       aspirin 81 MG EC tablet  Take 1 tablet (81 mg total) by mouth daily.     glipiZIDE 10 MG tablet  Commonly known as:  GLUCOTROL  Take 10 mg by mouth daily before breakfast.     metFORMIN 1000 MG tablet  Commonly known as:  GLUCOPHAGE  Take 1,000 mg by mouth 2 (two) times daily with a meal.     nitroGLYCERIN 0.4 MG SL tablet  Commonly known as:  NITROSTAT  Place 1 tablet (0.4 mg total) under the tongue every 5 (five) minutes x 3 doses as needed for chest pain.       Disposition and follow-up:   [name] was discharged from [facility] in stable condition.  At the hospital follow up visit please address:  Unstable angina--follow up outpatient cards and pcp. Any more chest pain? Anxiety related? Low risk nuclear stress test.   DM2--poorly controlled. Consider starting basal insulin if affordable or adjust po meds. May need glucose strips and meter that they are able to afford. Needs lots of diabetic education. Change prescriptions to [facility] if they can.   Anxiety--says celexa helped in past. Anxious often.   2.  Labs / imaging needed at time of follow-up: none  3.  Pending labs/ test needing follow-up: lipid panel ~1 year, TG elevated at 243. Not on statin for intolerance but consider if he is willing to maybe restart if needed.   Follow-up Appointments:     Follow-up Information   Follow up with CARROLL,MICHAEL On 11/26/2013. (11/26/2013 with Jacob Moores at [place] [facility]  [place], [address] in [place])       Discharge Instructions: Discharge Orders   Future Orders Complete By Expires   Call MD for:  difficulty breathing, headache or visual disturbances  As directed    Call MD for:  severe uncontrolled pain  As directed    Diet Carb Modified  As directed    Increase activity slowly  As directed       Consultations: Treatment Team:  Rounding Lbcardiology, MD  Procedures Performed:  Nm Myocar Multi W/spect W/wall Motion / Ef  11/05/2013   CLINICAL DATA:  Patient presented to ER with complaints of chest pain. He had similar CP in 2005 although much worse than now with normal workup including cardiac cath (per the patient done for abnormal nuclear stress test) at [facility] . He had some CP and saw his PCP a few weeks ago who put him on antibiotics. He said he initially felt better but this am started having 4/10 chest pain that he described as an aching sensation with pain in his upper back. 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 He felt weak with the CP but did not have syncope. He denies any recent URI symptoms and nothing really made the pain better but is made worse with activity. He currently is pain free. EKG is nonischemic and initial troponin and BNP  are normal.  EXAM: MYOCARDIAL IMAGING WITH SPECT (REST AND PHARMACOLOGIC-STRESS)  GATED LEFT VENTRICULAR WALL MOTION STUDY  LEFT VENTRICULAR EJECTION FRACTION  TECHNIQUE: Standard myocardial SPECT imaging was performed after resting intravenous injection of 10 mCi Tc-81m sestamibi. Subsequently, intravenous infusion of Lexiscan was performed under the supervision of the Cardiology staff. At peak effect of the drug, 30 mCi Tc-22m sestamibi was injected intravenously and standard myocardial SPECT imaging was performed. Quantitative gated imaging was also performed to evaluate left ventricular wall motion, and estimate left ventricular ejection fraction.  COMPARISON:  None.  FINDINGS: There is  decreased radiotracer uptake along the entire inferior wall distribution of moderate severity. This is likely representative of moderate diaphragmatic attenuation artifact. Old inferior infarct cannot be fully excluded although is less likely given the lack of wall motion abnormality. Ejection fraction 51%, no obvious wall motion abnormalities.  IMPRESSION: Overall low risk pharmacologic nuclear stress test with no evidence of ischemia. Fixed inferior wall defect most likely secondary to diaphragmatic attenuation artifact. Normal ejection fraction of 51% with no wall motion abnormalities. Sensitivity and specificity of study reduced by attenuation artifact.   Electronically Signed   By: Donato Schultz   On: 11/05/2013 14:20   Dg Chest Port 1 View  11/04/2013   CLINICAL DATA:  Chest pain, shortness of Breath  EXAM: PORTABLE CHEST - 1 VIEW  COMPARISON:  None.  FINDINGS: The heart size and mediastinal contours are within normal limits. Both lungs are clear. The visualized skeletal structures are unremarkable.  IMPRESSION: No active disease.   Electronically Signed   By: Natasha Mead M.D.   On: 11/04/2013 12:10   CLINICAL DATA: Patient presented to ER with complaints of chest  pain. He had similar CP in 2005 although much worse than now with  normal workup including cardiac cath (per the patient done for  abnormal nuclear stress test) at [facility] . He had some CP and saw  his PCP a few weeks ago who put him on antibiotics. He said he  initially felt better but this am started having 4/10 chest pain  that he described as an aching sensation with pain in his upper  back. 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 He felt  weak with the CP but did not have syncope. He denies any recent URI  symptoms and nothing really made the pain better but is made worse  with activity. He currently is pain free. EKG is nonischemic and  initial troponin and BNP are normal.  EXAM:  MYOCARDIAL IMAGING WITH SPECT  (REST AND PHARMACOLOGIC-STRESS)  GATED LEFT VENTRICULAR WALL MOTION STUDY  LEFT VENTRICULAR EJECTION FRACTION  TECHNIQUE:  Standard myocardial SPECT imaging was performed after resting  intravenous injection of 10 mCi Tc-109m sestamibi. Subsequently,  intravenous infusion of Lexiscan was performed under the supervision  of the Cardiology staff. At peak effect of the drug, 30 mCi Tc-58m  sestamibi was injected intravenously and standard myocardial SPECT  imaging was performed. Quantitative gated imaging was also performed  to evaluate left ventricular wall motion, and estimate left  ventricular ejection fraction.  COMPARISON: None.  FINDINGS:  There is decreased radiotracer uptake along the entire inferior wall  distribution of moderate severity. This is likely representative of  moderate diaphragmatic attenuation artifact. Old inferior infarct  cannot be fully excluded although is less likely given the lack of  wall motion abnormality. Ejection fraction 51%, no obvious wall  motion abnormalities.  IMPRESSION:  Overall low risk pharmacologic nuclear  stress test with no evidence  of ischemia. Fixed inferior wall defect most likely secondary to  diaphragmatic attenuation artifact. Normal ejection fraction of 51%  with no wall motion abnormalities. Sensitivity and specificity of  study reduced by attenuation artifact.  Electronically Signed  By: Donato SchultzMark Skains  On: 11/05/2013 14:20  Admission HPI: Mr. Thomos LemonsBurrow is a 58 year old obese white male with PMH of DM2, hyperlipidemia, HTN (on no medication), anxiety, and sleep apnea (not on cpap) who presented to the ED via ambulance for complaints of substernal chest pain/pressure this morning. He claims his pain started yesterday morning with pain noted between his shoulders that was intermittent, max 4-5/10 on pain scale, and spontaneously resolved. Then this morning around 9-10am, he felt substernal chest pressure, 3/10, and radiating to b/l upper  arms. The chest pain is aching in nature, improved after sitting down and lying back not exacerbated with movement. The pain is not related to food and he denies current heart burn but has had it in the past He says he has had similar pain in the past with negative work up. In 2006, he apparently had a cath but no blockages were found at [facility] in [place]. He also reports similar chest pain in November 2013 with negative stress test done [facility] per patient. His wife and daughter were present in the room as well who endorse that the patient and family have been under a lot of stress lately, with frequent loss of employment, recent diagnosis of wife with stage 3 breast cancer, and no health insurance. Mr. 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 He also is noted to suffer from anxiety at times and depression in the past that was treated with celexa. He no longer takes any antidepressants. He denies any shortness of breath, no chest pain at this time, no diaphoresis, nausea, vomiting, or diarrhea, but did have brief episodes x2 of chest aching since arriving to the ED (one in the ED and the other on the floor). Of note, he did received amoxicillin x 10 days by his PCP Dr. Noralyn Pickarroll for similar complaints ~ 3 weeks ago. However, he says he does not know why he was given the antibiotics but did complete the course.  He does admit to driving ~32~45 minutes daily back and forth from work to home and has lost ~55lbs over the past year without trying but has been eating less with decreased appetite. He endorses night sweats for several months-years and generalized fatigue since the pain started.   Hospital Course by problem list: Principal Problem:   Unstable angina Active Problems:   Diabetes type 2, uncontrolled   Unstable angina--risk factors include obesity, uncontrolled DM2, and distance history of HTN along with signficant  family history of heart disease in father and brother. Cardiac enzymes were negative. Chest pain resolved during admission.  Initially started on heparin gtt then discontinued.  Cardiology consulted, who did a nuclear stress test completed 4/14 with overall low risk with no evidence of ischemia. EF 51%. Anxiety is likely a large component of the pain.  Lipid panel with TG 243 not fasting, LDL 88, chol 165, a1c 9.4, tsh 3.070.  He says he cannot tolerate statin's in the past, thus not started on admission.  Discharged on asprin 81mg  daily and nitroglycerin prn. Follow up with cardiology and pcp as outpatient.   DM2--On glipizide 10mg  qam and metformin  1000mg  bid. A1C 9.2.  Hyperglycemia but unable to afford insulin at this time. Needs to change medications to [facility] possibly and also needs more diabetic education and diet control.  Continued on home medications on discharge and will need to follow up with pcp. Recommended cbg monitoring at home.  Discharge Vitals:   BP 125/74  Pulse 64  Temp(Src) 98 F (36.7 C) (Oral)  Resp 16  Ht 5\' 10"  (1.778 m)  Wt 248 lb (112.492 kg)  BMI 35.58 kg/m2  SpO2 99%  Discharge Labs:  Results for orders placed during the hospital encounter of 11/04/13 (from the past 24 hour(s))  GLUCOSE, CAPILLARY     Status: Abnormal   Collection Time    11/06/13  7:34 AM      Result Value Ref Range   Glucose-Capillary 209 (*) 70 - 99 mg/dL   Comment 1 Documented in Chart     Comment 2 Notify RN    GLUCOSE, CAPILLARY     Status: Abnormal   Collection Time    11/06/13 11:39 AM      Result Value Ref Range   Glucose-Capillary 224 (*) 70 - 99 mg/dL   Comment 1 Documented in Chart     Comment 2 Notify RN     Signed: Darden PalmerSamaya Jacey Pelc, MD 11/06/2013, 11:50 PM   Time Spent on Discharge: 35 minutes Services Ordered on Discharge: none Equipment Ordered on Discharge: none

## 2013-11-06 NOTE — Progress Notes (Signed)
Patient ID: Marcus Mercer, male   DOB: 1955/08/12, 58 y.o.   MRN: [id number]    Subjective:  No pain or complaints   Objective:  Filed Vitals:   11/05/13 1350 11/05/13 2000 11/05/13 2118 11/06/13 0500  BP: 128/78 162/80 135/84 125/74  Pulse: 92 70 81 64  Temp: 98.3 F (36.8 C) 98.9 F (37.2 C) 98.8 F (37.1 C) 98 F (36.7 C)  TempSrc: Oral  Oral   Resp: 16 16 16 16   Height:      Weight:    248 lb (112.492 kg)  SpO2: 96% 98% 96% 99%    Intake/Output from previous day:  Intake/Output Summary (Last 24 hours) at 11/06/13 0806 Last data filed at 11/05/13 1900  Gross per 24 hour  Intake    360 ml  Output      0 ml  Net    360 ml    Physical Exam: Affect appropriate Obese male  HEENT: normal Neck supple with no adenopathy JVP normal no bruits no thyromegaly Lungs clear with no wheezing and good diaphragmatic motion Heart:  S1/S2 no murmur, no rub, gallop or click PMI normal Abdomen: benighn, BS positve, no tenderness, no AAA no bruit.  No HSM or HJR Distal pulses intact with no bruits No edema Neuro non-focal Skin warm and dry No muscular weakness   Lab Results: Basic Metabolic Panel:  Recent Labs  40/98/1104/13/15 1118  NA 144  K 4.3  CL 107  CO2 21  GLUCOSE 204*  BUN 12  CREATININE 0.75  CALCIUM 9.5   Liver Function Tests:  Recent Labs  11/04/13 1118  AST 19  ALT 21  ALKPHOS 62  BILITOT 1.0  PROT 6.9  ALBUMIN 3.9   CBC:  Recent Labs  11/04/13 1118  WBC 8.1  NEUTROABS 5.0  HGB 16.3  HCT 45.2  MCV 85.3  PLT 163   Cardiac Enzymes:  Recent Labs  11/04/13 1118 11/04/13 1657 11/04/13 2240  TROPONINI <0.30 <0.30 <0.30    D-Dimer:  Recent Labs  11/04/13 1657  DDIMER <0.27   Hemoglobin A1C:  Recent Labs  11/04/13 1657  HGBA1C 9.4*   Fasting Lipid Panel:  Recent Labs  11/04/13 1657  CHOL 165  HDL 28*  LDLCALC 88  TRIG [id number]*  CHOLHDL 5.9   Thyroid Function Tests:  Recent Labs  11/04/13 1657  TSH 3.070     Imaging: Nm Myocar Multi W/spect W/wall Motion / Ef  11/05/2013   CLINICAL DATA:  Patient presented to ER with complaints of chest pain. He had similar CP in 2005 although much worse than now with normal workup including cardiac cath (per the patient done for abnormal nuclear stress test) at [facility] . He had some CP and saw his PCP a few weeks ago who put him on antibiotics. He said he initially felt better but this am started having 4/10 chest pain that he described as an aching sensation with pain in his upper back. 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 He felt weak with the CP but did not have syncope. He denies any recent URI symptoms and nothing really made the pain better but is made worse with activity. He currently is pain free. EKG is nonischemic and initial troponin and BNP are normal.  EXAM: MYOCARDIAL IMAGING WITH SPECT (REST AND PHARMACOLOGIC-STRESS)  GATED LEFT VENTRICULAR WALL MOTION STUDY  LEFT VENTRICULAR EJECTION FRACTION  TECHNIQUE: Standard myocardial SPECT imaging was performed after resting intravenous injection of 10 mCi  Tc-7423m sestamibi. Subsequently, intravenous infusion of Lexiscan was performed under the supervision of the Cardiology staff. At peak effect of the drug, 30 mCi Tc-3523m sestamibi was injected intravenously and standard myocardial SPECT imaging was performed. Quantitative gated imaging was also performed to evaluate left ventricular wall motion, and estimate left ventricular ejection fraction.  COMPARISON:  None.  FINDINGS: There is decreased radiotracer uptake along the entire inferior wall distribution of moderate severity. This is likely representative of moderate diaphragmatic attenuation artifact. Old inferior infarct cannot be fully excluded although is less likely given the lack of wall motion abnormality. Ejection fraction 51%, no obvious wall motion abnormalities.  IMPRESSION: Overall low risk pharmacologic nuclear stress test with no evidence of  ischemia. Fixed inferior wall defect most likely secondary to diaphragmatic attenuation artifact. Normal ejection fraction of 51% with no wall motion abnormalities. Sensitivity and specificity of study reduced by attenuation artifact.   Electronically Signed   By: Donato SchultzMark  Skains   On: 11/05/2013 14:20   Dg Chest Port 1 View  11/04/2013   CLINICAL DATA:  Chest pain, shortness of Breath  EXAM: PORTABLE CHEST - 1 VIEW  COMPARISON:  None.  FINDINGS: The heart size and mediastinal contours are within normal limits. Both lungs are clear. The visualized skeletal structures are unremarkable.  IMPRESSION: No active disease.   Electronically Signed   By: Natasha MeadLiviu  Pop M.D.   On: 11/04/2013 12:10    Cardiac Studies:  ECG:  NsR normal    Telemetry:  NSR no arrhythmia   Echo:   Medications:   . aspirin EC  81 mg Oral Daily  . insulin aspart  0-15 Units Subcutaneous TID WC  . pneumococcal 23 valent vaccine  0.5 mL Intramuscular Tomorrow-1000       Assessment/Plan:  Chest Pain :  Reviewed nuclear Normal diaphragm attenuation EF 51 %  Ok to d/c home discussed with primary   Echo also normal with EF 55-60% no RWMA;s   DM:  Oral hypoglycemics held can probably restart in am  Diabetic diet   Wendall Stadeeter C Lexus Barletta 11/06/2013, 8:06 AM

## 2013-11-06 NOTE — Discharge Instructions (Signed)
You were admitted for chest pain and shortness of breath. Your stress tests and echocardiogram were normal, and we found no evidence of coronary artery disease. If you have more chest pain or shortness of breath, please go to the emergency room.   Take aspirin 81 mg every day.  Follow up with your primary care provider, and discuss your symptoms of anxiety. Let him know that you had a good response previously to citalopram. You should also follow up with a cardiologist in your area.   Please keep taking your diabetic medications metformin and glipizide, and consider changing to [facility] for medication assistance.  Please work on your diabetic diet and improved glucose control. Glucose monitoring will also be a good idea to restart.   We have prescribed nitroglycerin as needed for chest pain, in case you have more pain   Panic Attacks Panic attacks are sudden, short-livedsurges of severe anxiety, fear, or discomfort. They may occur for no reason when you are relaxed, when you are anxious, or when you are sleeping. Panic attacks may occur for a number of reasons:   Healthy people occasionally have panic attacks in extreme, life-threatening situations, such as war or natural disasters. Normal anxiety is a protective mechanism of the body that helps us react to danger (fight or flight response).  Panic attacks are often seen with anxiety disorders, such as panic disorder, social anxiety disorder, generalized anxiety disorder, and phobias. Anxiety disorders cause excessive or uncontrollable anxiety. They may interfere with your relationships or other life activities.  Panic attacks are sometimes seen with other mental illnesses such as depression and posttraumatic stress disorder.  Certain medical conditions, prescription medicines, and drugs of abuse can cause panic attacks. SYMPTOMS  Panic attacks start suddenly, peak within 20 minutes, and are accompanied by four or more of the following  symptoms:  Pounding heart or fast heart rate (palpitations).  Sweating.  Trembling or shaking.  Shortness of breath or feeling smothered.  Feeling choked.  Chest pain or discomfort.  Nausea or strange feeling in your stomach.  Dizziness, lightheadedness, or feeling like you will faint.  Chills or hot flushes.  Numbness or tingling in your lips or hands and feet.  Feeling that things are not real or feeling that you are not yourself.  Fear of losing control or going crazy.  Fear of dying. Some of these symptoms can mimic serious medical conditions. For example, you may think you are having a heart attack. Although panic attacks can be very scary, they are not life threatening. DIAGNOSIS  Panic attacks are diagnosed through an assessment by your health care provider. Your health care provider will ask questions about your symptoms, such as where and when they occurred. Your health care provider will also ask about your medical history and use of alcohol and drugs, including prescription medicines. Your health care provider may order blood tests or other studies to rule out a serious medical condition. Your health care provider may refer you to a mental health professional for further evaluation. TREATMENT   Most healthy people who have one or two panic attacks in an extreme, life-threatening situation will not require treatment.  The treatment for panic attacks associated with anxiety disorders or other mental illness typically involves counseling with a mental health professional, medicine, or a combination of both. Your health care provider will help determine what treatment is best for you.  Panic attacks due to physical illness usually goes away with treatment of the illness. If prescription  medicine is causing panic attacks, talk with your health care provider about stopping the medicine, decreasing the dose, or substituting another medicine.  Panic attacks due to alcohol or  drug abuse goes away with abstinence. Some adults need professional help in order to stop drinking or using drugs. HOME CARE INSTRUCTIONS   Take all your medicines as prescribed.   Check with your health care provider before starting new prescription or over-the-counter medicines.  Keep all follow up appointments with your health care provider. SEEK MEDICAL CARE IF:  You are not able to take your medicines as prescribed.  Your symptoms do not improve or get worse. SEEK IMMEDIATE MEDICAL CARE IF:   You experience panic attack symptoms that are different than your usual symptoms.  You have serious thoughts about hurting yourself or others.  You are taking medicine for panic attacks and have a serious side effect. MAKE SURE YOU:  Understand these instructions.  Will watch your condition.  Will get help right away if you are not doing well or get worse. Document Released: 07/11/2005 Document Revised: 05/01/2013 Document Reviewed: 02/22/2013 [facility] Patient Information 2014 [employer] [place].

## 2013-11-06 NOTE — Progress Notes (Signed)
D/c orders received;IV removed with gauze on, pt remains in stable condition, pt meds and instructions reviewed and given to pt; pt d/c to home 

## 2015-03-23 IMAGING — CR DG CHEST 1V PORT
1 series · 1 of 1 positions shown · non-contrast
Comparison: None.

CLINICAL DATA: Chest pain, shortness of Breath

EXAM:
PORTABLE CHEST - 1 VIEW

[AP]
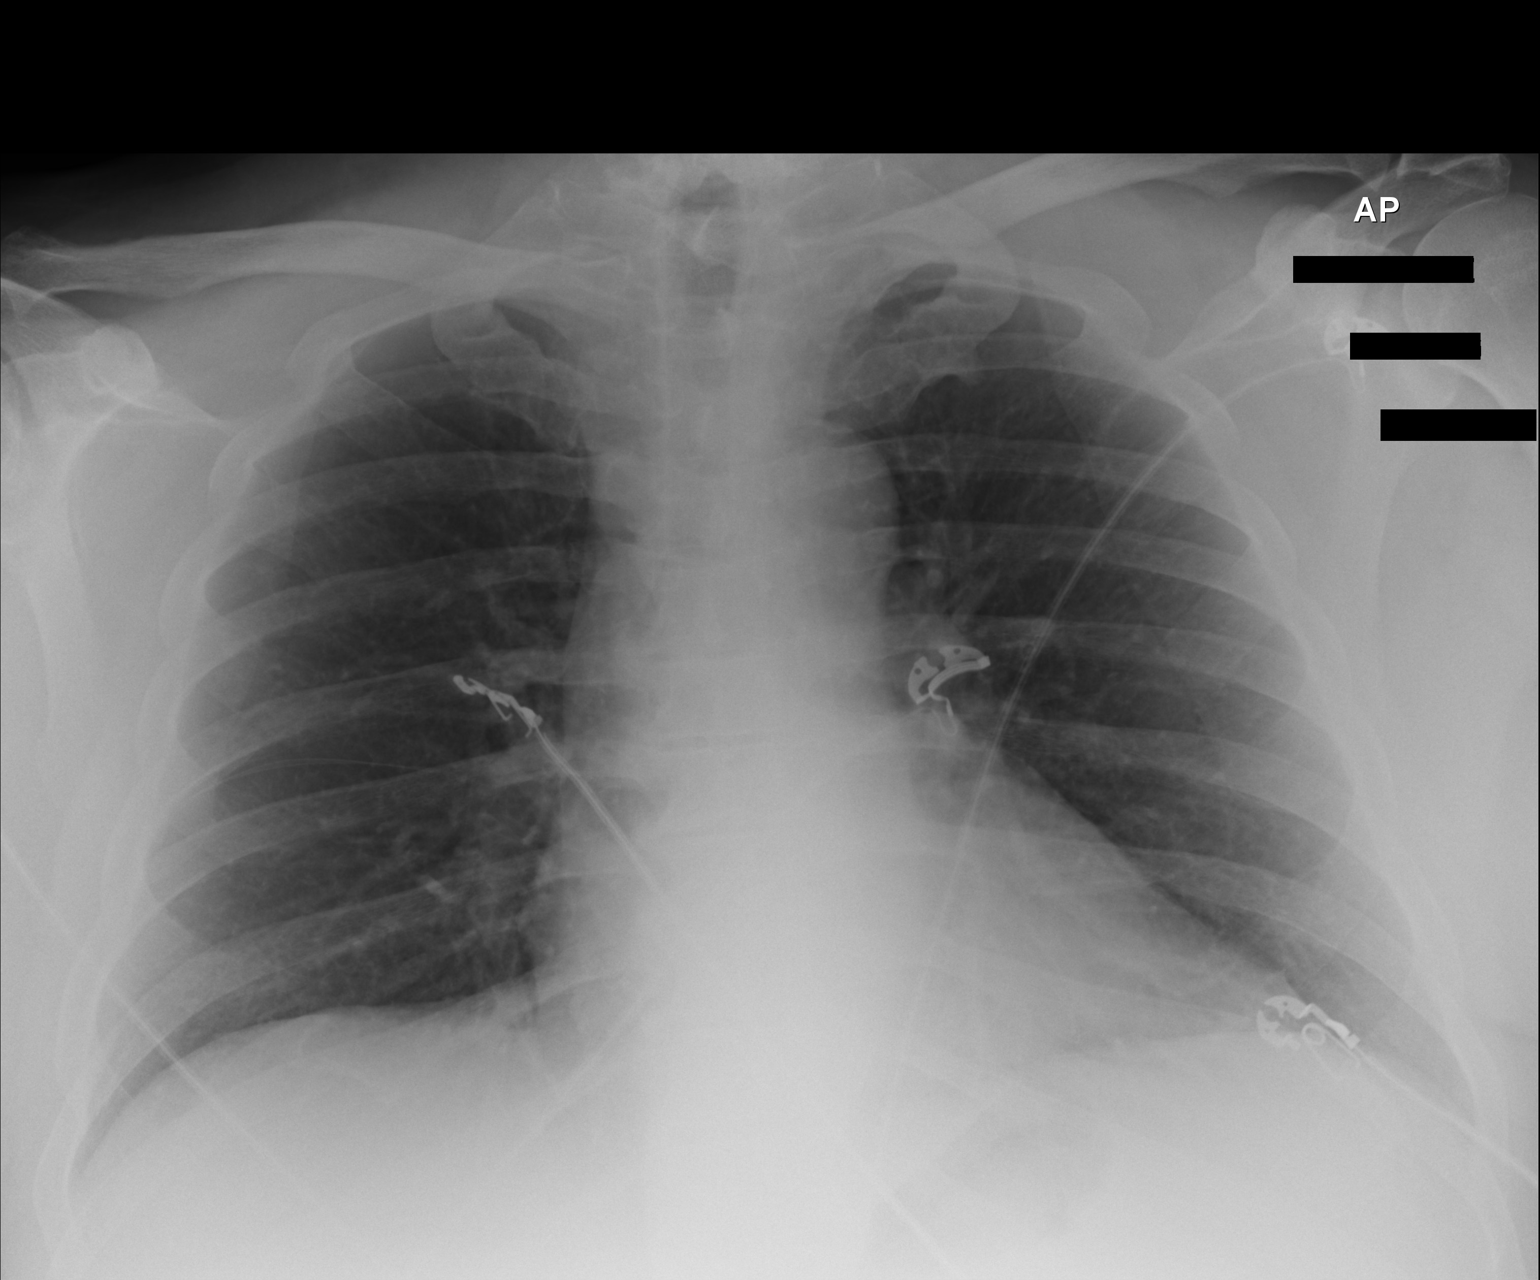

[1 of 1 positions shown; findings below may reference images not displayed]

FINDINGS: The heart size and mediastinal contours are within normal limits.
Both lungs are clear. The visualized skeletal structures are
unremarkable.
IMPRESSION: No active disease.
